# Patient Record
Sex: Female | Born: 1945
Health system: Southern US, Community
[De-identification: ages and names within clinical notes are randomized; demographics above are authoritative.]

## PROBLEM LIST (undated history)

## (undated) DIAGNOSIS — I10 Essential (primary) hypertension: Secondary | ICD-10-CM

## (undated) DIAGNOSIS — E7800 Pure hypercholesterolemia, unspecified: Secondary | ICD-10-CM

---

## 1972-10-13 HISTORY — PX: PARTIAL HYSTERECTOMY: SHX80

## 1999-06-25 ENCOUNTER — Encounter: Payer: Self-pay | Admitting: Emergency Medicine

## 1999-06-25 ENCOUNTER — Emergency Department (HOSPITAL_COMMUNITY): Admission: EM | Admit: 1999-06-25 | Discharge: 1999-06-25 | Payer: Self-pay | Admitting: Emergency Medicine

## 2007-06-24 ENCOUNTER — Ambulatory Visit: Payer: Self-pay | Admitting: Nurse Practitioner

## 2007-06-24 ENCOUNTER — Ambulatory Visit: Payer: Self-pay | Admitting: *Deleted

## 2007-06-24 LAB — CONVERTED CEMR LAB
ALT: 16 units/L (ref 0–53)
AST: 21 units/L (ref 0–37)
Albumin: 4.1 g/dL (ref 3.5–5.2)
Alkaline Phosphatase: 93 units/L (ref 39–117)
BUN: 21 mg/dL (ref 6–23)
Basophils Absolute: 0 10*3/uL (ref 0.0–0.1)
Basophils Relative: 0 % (ref 0–1)
CO2: 25 meq/L (ref 19–32)
Calcium: 10 mg/dL (ref 8.4–10.5)
Chloride: 105 meq/L (ref 96–112)
Creatinine, Ser: 1.13 mg/dL (ref 0.40–1.50)
Eosinophils Absolute: 0.2 10*3/uL (ref 0.0–0.7)
Eosinophils Relative: 4 % (ref 0–5)
Glucose, Bld: 136 mg/dL — ABNORMAL HIGH (ref 70–99)
HCT: 38.2 % — ABNORMAL LOW (ref 39.0–52.0)
Hemoglobin: 12 g/dL — ABNORMAL LOW (ref 13.0–17.0)
Lymphocytes Relative: 43 % (ref 12–46)
Lymphs Abs: 2.4 10*3/uL (ref 0.7–3.3)
MCHC: 31.4 g/dL (ref 30.0–36.0)
MCV: 87.2 fL (ref 78.0–100.0)
Monocytes Absolute: 0.4 10*3/uL (ref 0.2–0.7)
Monocytes Relative: 6 % (ref 3–11)
Neutro Abs: 2.6 10*3/uL (ref 1.7–7.7)
Neutrophils Relative %: 47 % (ref 43–77)
Platelets: 280 10*3/uL (ref 150–400)
Potassium: 4 meq/L (ref 3.5–5.3)
RBC: 4.38 M/uL (ref 4.22–5.81)
RDW: 14.5 % — ABNORMAL HIGH (ref 11.5–14.0)
Sodium: 141 meq/L (ref 135–145)
TSH: 4.82 microintl units/mL (ref 0.350–5.50)
Total Bilirubin: 0.3 mg/dL (ref 0.3–1.2)
Total Protein: 7.8 g/dL (ref 6.0–8.3)
WBC: 5.6 10*3/uL (ref 4.0–10.5)

## 2007-06-25 ENCOUNTER — Encounter (INDEPENDENT_AMBULATORY_CARE_PROVIDER_SITE_OTHER): Payer: Self-pay | Admitting: Nurse Practitioner

## 2007-06-25 LAB — CONVERTED CEMR LAB: Hgb A1c MFr Bld: 7 % — ABNORMAL HIGH (ref 4.6–6.1)

## 2007-07-02 ENCOUNTER — Ambulatory Visit (HOSPITAL_COMMUNITY): Admission: RE | Admit: 2007-07-02 | Discharge: 2007-07-02 | Payer: Self-pay | Admitting: Family Medicine

## 2007-07-12 ENCOUNTER — Encounter: Admission: RE | Admit: 2007-07-12 | Discharge: 2007-07-12 | Payer: Self-pay | Admitting: Family Medicine

## 2007-07-14 ENCOUNTER — Encounter (INDEPENDENT_AMBULATORY_CARE_PROVIDER_SITE_OTHER): Payer: Self-pay | Admitting: Nurse Practitioner

## 2007-07-14 ENCOUNTER — Ambulatory Visit: Payer: Self-pay | Admitting: Family Medicine

## 2007-07-14 LAB — CONVERTED CEMR LAB
Cholesterol: 219 mg/dL — ABNORMAL HIGH (ref 0–200)
HDL: 56 mg/dL (ref >39)
LDL Cholesterol: 142 mg/dL — ABNORMAL HIGH (ref 0–99)
Total CHOL/HDL Ratio: 3.9
Triglycerides: 103 mg/dL (ref <150)
VLDL: 21 mg/dL (ref 0–40)

## 2007-07-28 ENCOUNTER — Ambulatory Visit: Payer: Self-pay | Admitting: Internal Medicine

## 2007-07-30 ENCOUNTER — Ambulatory Visit: Payer: Self-pay | Admitting: Family Medicine

## 2007-08-25 ENCOUNTER — Ambulatory Visit: Payer: Self-pay | Admitting: Family Medicine

## 2007-11-25 ENCOUNTER — Encounter (INDEPENDENT_AMBULATORY_CARE_PROVIDER_SITE_OTHER): Payer: Self-pay | Admitting: Nurse Practitioner

## 2007-11-25 ENCOUNTER — Ambulatory Visit: Payer: Self-pay | Admitting: Internal Medicine

## 2007-11-25 LAB — CONVERTED CEMR LAB
ALT: 14 units/L (ref 0–53)
AST: 17 units/L (ref 0–37)
Albumin: 4.5 g/dL (ref 3.5–5.2)
Alkaline Phosphatase: 67 units/L (ref 39–117)
BUN: 50 mg/dL — ABNORMAL HIGH (ref 6–23)
CO2: 21 meq/L (ref 19–32)
Calcium: 9.7 mg/dL (ref 8.4–10.5)
Chloride: 106 meq/L (ref 96–112)
Cholesterol: 166 mg/dL (ref 0–200)
Creatinine, Ser: 1.72 mg/dL — ABNORMAL HIGH (ref 0.40–1.50)
Glucose, Bld: 105 mg/dL — ABNORMAL HIGH (ref 70–99)
HDL: 52 mg/dL (ref >39)
LDL Cholesterol: 90 mg/dL (ref 0–99)
Potassium: 4.9 meq/L (ref 3.5–5.3)
Sodium: 139 meq/L (ref 135–145)
Total Bilirubin: 0.4 mg/dL (ref 0.3–1.2)
Total CHOL/HDL Ratio: 3.2
Total Protein: 8 g/dL (ref 6.0–8.3)
Triglycerides: 121 mg/dL (ref <150)
VLDL: 24 mg/dL (ref 0–40)

## 2008-07-07 ENCOUNTER — Ambulatory Visit: Payer: Self-pay | Admitting: Internal Medicine

## 2008-07-11 ENCOUNTER — Ambulatory Visit: Payer: Self-pay | Admitting: Family Medicine

## 2008-11-29 ENCOUNTER — Ambulatory Visit: Payer: Self-pay | Admitting: Family Medicine

## 2009-11-14 ENCOUNTER — Ambulatory Visit: Payer: Self-pay | Admitting: Internal Medicine

## 2009-11-14 LAB — CONVERTED CEMR LAB: Microalb, Ur: 0.8 mg/dL (ref 0.00–1.89)

## 2010-08-22 ENCOUNTER — Encounter (INDEPENDENT_AMBULATORY_CARE_PROVIDER_SITE_OTHER): Payer: Self-pay | Admitting: *Deleted

## 2010-08-22 LAB — CONVERTED CEMR LAB
ALT: 14 units/L (ref 0–53)
AST: 15 units/L (ref 0–37)
Albumin: 4.2 g/dL (ref 3.5–5.2)
Alkaline Phosphatase: 64 units/L (ref 39–117)
BUN: 23 mg/dL (ref 6–23)
CO2: 26 meq/L (ref 19–32)
Calcium: 9.7 mg/dL (ref 8.4–10.5)
Chloride: 107 meq/L (ref 96–112)
Cholesterol: 211 mg/dL — ABNORMAL HIGH (ref 0–200)
Creatinine, Ser: 1.24 mg/dL (ref 0.40–1.50)
Glucose, Bld: 101 mg/dL — ABNORMAL HIGH (ref 70–99)
HDL: 57 mg/dL (ref >39)
Hgb A1c MFr Bld: 6.3 % — ABNORMAL HIGH (ref <5.7)
LDL Cholesterol: 138 mg/dL — ABNORMAL HIGH (ref 0–99)
Potassium: 4.6 meq/L (ref 3.5–5.3)
Sodium: 142 meq/L (ref 135–145)
Total Bilirubin: 0.3 mg/dL (ref 0.3–1.2)
Total CHOL/HDL Ratio: 3.7
Total Protein: 7.4 g/dL (ref 6.0–8.3)
Triglycerides: 80 mg/dL (ref <150)
VLDL: 16 mg/dL (ref 0–40)

## 2011-09-04 ENCOUNTER — Encounter (HOSPITAL_COMMUNITY): Payer: Self-pay | Admitting: Emergency Medicine

## 2011-09-04 ENCOUNTER — Emergency Department (HOSPITAL_COMMUNITY)
Admission: EM | Admit: 2011-09-04 | Discharge: 2011-09-04 | Disposition: A | Discharge status: Home / Self Care | Payer: PRIVATE HEALTH INSURANCE | Attending: Emergency Medicine | Admitting: Emergency Medicine

## 2011-09-04 DIAGNOSIS — E119 Type 2 diabetes mellitus without complications: Secondary | ICD-10-CM | POA: Insufficient documentation

## 2011-09-04 DIAGNOSIS — R6884 Jaw pain: Secondary | ICD-10-CM

## 2011-09-04 DIAGNOSIS — I10 Essential (primary) hypertension: Secondary | ICD-10-CM | POA: Insufficient documentation

## 2011-09-04 DIAGNOSIS — H9209 Otalgia, unspecified ear: Secondary | ICD-10-CM | POA: Insufficient documentation

## 2011-09-04 HISTORY — DX: Essential (primary) hypertension: I10

## 2011-09-04 HISTORY — DX: Pure hypercholesterolemia, unspecified: E78.00

## 2011-09-04 MED ORDER — ACETAMINOPHEN 325 MG PO TABS
975.0000 mg | ORAL_TABLET | Freq: Once | ORAL | Status: AC
  Administered 2011-09-04: 975 mg via ORAL
  Filled 2011-09-04: qty 3

## 2011-09-04 MED ORDER — TRAMADOL HCL 50 MG PO TABS: 50.0000 mg | ORAL_TABLET | Freq: Four times a day (QID) | ORAL | Status: AC | PRN

## 2011-09-04 NOTE — ED Provider Notes (Signed)
History     CSN: [id number] Arrival date & time: 09/04/2011  5:53 PM   First MD Initiated Contact with Patient 09/04/11 1756      No chief complaint on file.   (Consider location/radiation/quality/duration/timing/severity/associated sxs/prior treatment) HPI Comments: Patient complains of several days of pain behind her right ear and in her right jaw.  The pain is worse upon chewing and opening and closing her mouth.  She has had no broken teeth.  No fevers.  No changes in voice and no shortness of breath.  No difficulty swallowing.  No redness or swelling is noted.  No ear drainage.  Minimal sinus drainage but no significant sneezing or other cold symptoms.  No sore throat.  Patient presents today because it's continued to hurt and has kept her up at night.  Has not tried any pain medications at home.  Patient is a 65 y.o. female presenting with ear pain. The history is provided by the patient.  Otalgia Pertinent negatives include no headaches, no abdominal pain, no vomiting, no cough and no rash.    Past Medical History  Diagnosis Date  . Diabetes mellitus   . Hypertension     History reviewed. No pertinent past surgical history.  No family history on file.  History  Substance Use Topics  . Smoking status: Never Smoker   . Smokeless tobacco: Not on file  . Alcohol Use:       Review of Systems  Constitutional: Negative.  Negative for fever and chills.  HENT: Positive for ear pain.   Eyes: Negative.  Negative for discharge and redness.  Respiratory: Negative.  Negative for cough and shortness of breath.   Cardiovascular: Negative.  Negative for chest pain.  Gastrointestinal: Negative.  Negative for nausea, vomiting and abdominal pain.  Genitourinary: Negative.  Negative for hematuria.  Musculoskeletal: Negative.  Negative for back pain.  Skin: Negative.  Negative for color change and rash.  Neurological: Negative for syncope and headaches.  Hematological: Negative.   Negative for adenopathy.  Psychiatric/Behavioral: Negative.  Negative for confusion.  All other systems reviewed and are negative.    Allergies  Review of patient's allergies indicates no known allergies.  Home Medications  No current outpatient prescriptions on file.  There were no vitals taken for this visit.  Physical Exam  Constitutional: He is oriented to person, place, and time. He appears well-developed and well-nourished.  HENT:  Head: Normocephalic and atraumatic.  Right Ear: External ear normal.  Left Ear: External ear normal.  Mouth/Throat: Oropharynx is clear and moist. No oropharyngeal exudate.  Eyes: Conjunctivae and EOM are normal. Pupils are equal, round, and reactive to light.  Neck: Normal range of motion. Neck supple.  Pulmonary/Chest: Effort normal.  Musculoskeletal: Normal range of motion.  Lymphadenopathy:    He has no cervical adenopathy.  Neurological: He is alert and oriented to person, place, and time.  Skin: Skin is warm and dry.  Psychiatric: He has a normal mood and affect. His behavior is normal. Judgment and thought content normal.    ED Course  Procedures (including critical care time)  Labs Reviewed - No data to display No results found.   No diagnosis found.    MDM  Patient presents with right-sided jaw pain without signs of infectious etiology.  Her teeth and gums appear healthy and intact without signs of redness or tenderness on exam.  Ear shows no signs of otitis media or otitis externa.  No tenderness over palpation of her mastoid to  suggest mastoiditis.  Patient has no lymphadenopathy in the submandibular region or cervical lymphadenopathy.  Patient also has no fevers.  She does note that she grinds her teeth at night and this may cause some TMJ.  At this point I will prescribe the patient some pain medicine and advised her to followup with her primary care physician and/or the oral facial surgeon.  She also knows to return if she  starts having other symptoms such as increased redness, swelling, fevers or other concerns.        Nat Christen, MD 09/04/11 [phone]

## 2013-01-30 ENCOUNTER — Emergency Department (HOSPITAL_COMMUNITY): Payer: Medicare HMO

## 2013-01-30 ENCOUNTER — Emergency Department (HOSPITAL_COMMUNITY)
Admission: EM | Admit: 2013-01-30 | Discharge: 2013-01-30 | Disposition: A | Discharge status: Home / Self Care | Payer: Medicare HMO | Attending: Emergency Medicine | Admitting: Emergency Medicine

## 2013-01-30 ENCOUNTER — Encounter (HOSPITAL_COMMUNITY): Payer: Self-pay | Admitting: Emergency Medicine

## 2013-01-30 DIAGNOSIS — E7800 Pure hypercholesterolemia, unspecified: Secondary | ICD-10-CM | POA: Insufficient documentation

## 2013-01-30 DIAGNOSIS — R071 Chest pain on breathing: Secondary | ICD-10-CM | POA: Insufficient documentation

## 2013-01-30 DIAGNOSIS — R059 Cough, unspecified: Secondary | ICD-10-CM

## 2013-01-30 DIAGNOSIS — R0602 Shortness of breath: Secondary | ICD-10-CM | POA: Insufficient documentation

## 2013-01-30 DIAGNOSIS — Z79899 Other long term (current) drug therapy: Secondary | ICD-10-CM | POA: Insufficient documentation

## 2013-01-30 DIAGNOSIS — R05 Cough: Secondary | ICD-10-CM

## 2013-01-30 DIAGNOSIS — E119 Type 2 diabetes mellitus without complications: Secondary | ICD-10-CM | POA: Insufficient documentation

## 2013-01-30 DIAGNOSIS — J069 Acute upper respiratory infection, unspecified: Secondary | ICD-10-CM | POA: Insufficient documentation

## 2013-01-30 DIAGNOSIS — I10 Essential (primary) hypertension: Secondary | ICD-10-CM | POA: Insufficient documentation

## 2013-01-30 DIAGNOSIS — Z7982 Long term (current) use of aspirin: Secondary | ICD-10-CM | POA: Insufficient documentation

## 2013-01-30 DIAGNOSIS — J3489 Other specified disorders of nose and nasal sinuses: Secondary | ICD-10-CM | POA: Insufficient documentation

## 2013-01-30 DIAGNOSIS — R209 Unspecified disturbances of skin sensation: Secondary | ICD-10-CM | POA: Insufficient documentation

## 2013-01-30 MED ORDER — BENZONATATE 100 MG PO CAPS: 100.0000 mg | ORAL_CAPSULE | Freq: Three times a day (TID) | ORAL | Status: DC

## 2013-01-30 NOTE — ED Notes (Signed)
Pt c/o URI and cough with intermittent fever

## 2013-01-30 NOTE — ED Notes (Signed)
Patient states she has had this non productive cough x2 days. She has not taken any OTC's to treat at home. She denies having any fevers with the cough.

## 2013-01-30 NOTE — ED Provider Notes (Signed)
History    This chart was scribed for non-physician practitioner Raymon Mutton working with Ward Givens, MD by Quintella Reichert, ED Scribe. This patient was seen in room TR08C/TR08C and the patient's care was started at 4:04 PM .   CSN: [id number]  Arrival date & time 01/30/13  1418       Chief Complaint  Patient presents with  . URI  . Cough     The history is provided by the patient. No language interpreter was used.   Kristen Cannon is a 67 y.o. female who presents to the Emergency Department complaining of cough that began several days ago, with accompanying nasal congestion and intermittent clear rhinorrhea.  Pt describes cough as wet but not productive of sputum.  She states cough is worst at night, and is not relieved by anything she has tried.  She also reports sharp chest wall pain with cough, mild nasal pain due to congestion, and mild intermittent SOB.  Pt reports she has been baby-sitting her grandson, who also has a cold.  She denies sore throat, difficulty swallowing, back pain, neck pain, eye itching, eye discharge, difficulty swallowing, urinary or bowel symptoms, or tinnitus.  She also reports a fever that occurred 2 days ago, but did not take her temperature.  She has not been taking medication.  Pt also reports intermittent left-sided numbness and tingling with onset several months ago, that is currently at baseline.  She states it is worse at night, and that episodes of numbness last for several seconds.  Pt denies weakness of left extremities.  She has not consulted her PCP in regard to numbness, but has appointment on 02/14/13.  PCP is Dr. Allyne Gee.   Past Medical History  Diagnosis Date  . Diabetes mellitus   . Hypertension   . High cholesterol     History reviewed. No pertinent past surgical history.  History reviewed. No pertinent family history.  History  Substance Use Topics  . Smoking status: Never Smoker   . Smokeless tobacco: Not on file  .  Alcohol Use:     OB History   Grav Para Term Preterm Abortions TAB SAB Ect Mult Living                  Review of Systems  HENT: Negative for trouble swallowing, neck pain, tinnitus and ear discharge.   Eyes: Negative for itching.  Respiratory:       Chest wall pain on coughing  Gastrointestinal: Negative for nausea, vomiting, abdominal pain and diarrhea.  Genitourinary: Negative for dysuria and difficulty urinating.  Musculoskeletal: Negative for back pain.  Neurological: Positive for numbness. Negative for weakness.  All other systems reviewed and are negative.     Allergies  Review of patient's allergies indicates no known allergies.  Home Medications   Current Outpatient Rx  Name  Route  Sig  Dispense  Refill  . aspirin EC 81 MG tablet   Oral   Take 81 mg by mouth every morning.         Marland Kitchen atorvastatin (LIPITOR) 20 MG tablet   Oral   Take 20 mg by mouth daily.         . cloNIDine (CATAPRES) 0.2 MG tablet   Oral   Take 0.2 mg by mouth daily.         Marland Kitchen glipiZIDE (GLUCOTROL) 5 MG tablet   Oral   Take 5 mg by mouth 2 (two) times daily before a meal.          .  hydrochlorothiazide (HYDRODIURIL) 25 MG tablet   Oral   Take 25 mg by mouth daily.         Marland Kitchen lisinopril (PRINIVIL,ZESTRIL) 20 MG tablet   Oral   Take 20 mg by mouth 2 (two) times daily.           BP 129/81  Pulse 88  Temp(Src) 98.2 F (36.8 C) (Oral)  Resp 18  SpO2 98%  Physical Exam  Nursing note and vitals reviewed. Constitutional: She is oriented to person, place, and time. She appears well-developed and well-nourished. No distress.  HENT:  Head: Normocephalic and atraumatic.  Mouth/Throat: Oropharynx is clear and moist. No oropharyngeal exudate.  Uvula midline, no erythema, no exudate. Good nasal patency.  Eyes: Conjunctivae and EOM are normal. Pupils are equal, round, and reactive to light. Right eye exhibits no discharge. Left eye exhibits no discharge.  Neck: Normal range  of motion. Neck supple. No tracheal deviation present.  Negative lymphadenopathy  Cardiovascular: Normal rate, regular rhythm, normal heart sounds and intact distal pulses.   No murmur heard. Radial pulses 2+ bilaterally Pedal pulses 2+ bilaterally Negative leg and ankle swelling Negative pitting edema  Pulmonary/Chest: Effort normal and breath sounds normal. No respiratory distress. She has no wheezes. She has no rales.  Musculoskeletal: Normal range of motion. She exhibits no edema and no tenderness.  Lymphadenopathy:    She has no cervical adenopathy.  Neurological: She is alert and oriented to person, place, and time. No cranial nerve deficit. She exhibits normal muscle tone. Coordination normal.  Skin: Skin is warm and dry. No rash noted. No erythema.  Psychiatric: She has a normal mood and affect. Her behavior is normal. Thought content normal.     ED Course  Procedures (including critical care time)  DIAGNOSTIC STUDIES: Oxygen Saturation is 98% on room air, normal by my interpretation.    COORDINATION OF CARE: 4:16 PM-Discussed likelihood that symptoms are due to URI, and explained treatment plan which includes imaging with pt at bedside and pt agreed to plan.   10:32 AM: Informed pt that CXRresults were negative, and that symptoms are most likely due to viral URI.  Explained treatment plan including Tessalon, rest, hydration, f/u with PCP this week, and f/u at ED if new symptoms develop.  Pt agreed to plan.   Labs Reviewed - No data to display No results found.   1. URI (upper respiratory infection)   2. Cough   3. HTN (hypertension)   4. DM (diabetes mellitus)       MDM  I personally performed the services described in this documentation, which was scribed in my presence. The recorded information has been reviewed and is accurate.  Patient afebrile, normotensive, non-tachycardic, alert and oriented. Patient cooperative and calm throughout exam. Wet cough noted  with no sputum released from mouth. Physical exam unremarkable. Chest xray negative findings - nurse called and received findings from radiology. Patient aseptic, non-toxic appearing, no acute distress. Discharged patient. Suspected URI - viral in nature. Discharged patient with Tessalon to aid in cough relief. Discussed with patient to follow-up with Dr. Allyne Gee regarding visit to the ED, need to be re-evaluated, as well as discuss the sporadic episodes of numbness to the left side. Discussed with patient to rest, stay hydrated, Tylenol can be used for fever if it should develop. Discussed with patient to monitor symptoms and if symptoms worsen or change to report back to the ED. Patient agreed to plan of care, understood, all questions answered.  Raymon Mutton, PA-C 01/31/13 1032 ED provider identified for [facility], [place] 02/01/13 1823

## 2013-02-02 NOTE — ED Provider Notes (Signed)
Medical screening examination/treatment/procedure(s) were performed by non-physician practitioner and as supervising physician I was immediately available for consultation/collaboration. Tarus Briski, MD, FACEP   Severin Bou L Kelli Egolf, MD 02/02/13 1052 

## 2013-12-13 ENCOUNTER — Other Ambulatory Visit: Payer: Self-pay

## 2013-12-13 DIAGNOSIS — Z1231 Encounter for screening mammogram for malignant neoplasm of breast: Secondary | ICD-10-CM

## 2013-12-13 DIAGNOSIS — Z803 Family history of malignant neoplasm of breast: Secondary | ICD-10-CM

## 2013-12-14 ENCOUNTER — Ambulatory Visit
Admission: RE | Admit: 2013-12-14 | Discharge: 2013-12-14 | Disposition: A | Discharge status: Home / Self Care | Payer: Medicare HMO | Source: Ambulatory Visit

## 2013-12-14 DIAGNOSIS — Z803 Family history of malignant neoplasm of breast: Secondary | ICD-10-CM

## 2013-12-14 DIAGNOSIS — Z1231 Encounter for screening mammogram for malignant neoplasm of breast: Secondary | ICD-10-CM

## 2014-10-25 DIAGNOSIS — E782 Mixed hyperlipidemia: Secondary | ICD-10-CM | POA: Diagnosis not present

## 2014-10-25 DIAGNOSIS — Z0000 Encounter for general adult medical examination without abnormal findings: Secondary | ICD-10-CM | POA: Diagnosis not present

## 2014-12-12 ENCOUNTER — Other Ambulatory Visit: Payer: Self-pay

## 2014-12-12 DIAGNOSIS — Z1231 Encounter for screening mammogram for malignant neoplasm of breast: Secondary | ICD-10-CM

## 2014-12-27 ENCOUNTER — Ambulatory Visit
Admission: RE | Admit: 2014-12-27 | Discharge: 2014-12-27 | Disposition: A | Discharge status: Home / Self Care | Payer: Medicare Other | Source: Ambulatory Visit

## 2014-12-27 DIAGNOSIS — Z1231 Encounter for screening mammogram for malignant neoplasm of breast: Secondary | ICD-10-CM

## 2015-03-05 DIAGNOSIS — E1122 Type 2 diabetes mellitus with diabetic chronic kidney disease: Secondary | ICD-10-CM | POA: Diagnosis not present

## 2015-03-05 DIAGNOSIS — E669 Obesity, unspecified: Secondary | ICD-10-CM | POA: Diagnosis not present

## 2015-03-05 DIAGNOSIS — Z713 Dietary counseling and surveillance: Secondary | ICD-10-CM | POA: Diagnosis not present

## 2015-03-05 DIAGNOSIS — N183 Chronic kidney disease, stage 3 (moderate): Secondary | ICD-10-CM | POA: Diagnosis not present

## 2015-04-06 DIAGNOSIS — H40013 Open angle with borderline findings, low risk, bilateral: Secondary | ICD-10-CM | POA: Diagnosis not present

## 2015-04-06 DIAGNOSIS — D3101 Benign neoplasm of right conjunctiva: Secondary | ICD-10-CM | POA: Diagnosis not present

## 2015-04-06 DIAGNOSIS — H2513 Age-related nuclear cataract, bilateral: Secondary | ICD-10-CM | POA: Diagnosis not present

## 2015-04-06 DIAGNOSIS — E119 Type 2 diabetes mellitus without complications: Secondary | ICD-10-CM | POA: Diagnosis not present

## 2015-06-06 DIAGNOSIS — N183 Chronic kidney disease, stage 3 (moderate): Secondary | ICD-10-CM | POA: Diagnosis not present

## 2015-06-06 DIAGNOSIS — I129 Hypertensive chronic kidney disease with stage 1 through stage 4 chronic kidney disease, or unspecified chronic kidney disease: Secondary | ICD-10-CM | POA: Diagnosis not present

## 2015-06-06 DIAGNOSIS — E1122 Type 2 diabetes mellitus with diabetic chronic kidney disease: Secondary | ICD-10-CM | POA: Diagnosis not present

## 2015-08-30 DIAGNOSIS — I129 Hypertensive chronic kidney disease with stage 1 through stage 4 chronic kidney disease, or unspecified chronic kidney disease: Secondary | ICD-10-CM | POA: Diagnosis not present

## 2015-08-30 DIAGNOSIS — N08 Glomerular disorders in diseases classified elsewhere: Secondary | ICD-10-CM | POA: Diagnosis not present

## 2015-08-30 DIAGNOSIS — N183 Chronic kidney disease, stage 3 (moderate): Secondary | ICD-10-CM | POA: Diagnosis not present

## 2015-08-30 DIAGNOSIS — I10 Essential (primary) hypertension: Secondary | ICD-10-CM | POA: Diagnosis not present

## 2015-08-30 DIAGNOSIS — E1122 Type 2 diabetes mellitus with diabetic chronic kidney disease: Secondary | ICD-10-CM | POA: Diagnosis not present

## 2015-09-20 DIAGNOSIS — H40013 Open angle with borderline findings, low risk, bilateral: Secondary | ICD-10-CM | POA: Diagnosis not present

## 2015-09-20 DIAGNOSIS — H2513 Age-related nuclear cataract, bilateral: Secondary | ICD-10-CM | POA: Diagnosis not present

## 2015-09-20 DIAGNOSIS — H35363 Drusen (degenerative) of macula, bilateral: Secondary | ICD-10-CM | POA: Diagnosis not present

## 2015-09-20 DIAGNOSIS — H25013 Cortical age-related cataract, bilateral: Secondary | ICD-10-CM | POA: Diagnosis not present

## 2015-09-21 DIAGNOSIS — N390 Urinary tract infection, site not specified: Secondary | ICD-10-CM | POA: Diagnosis not present

## 2015-12-04 ENCOUNTER — Other Ambulatory Visit: Payer: Self-pay

## 2015-12-04 DIAGNOSIS — Z1231 Encounter for screening mammogram for malignant neoplasm of breast: Secondary | ICD-10-CM

## 2015-12-06 DIAGNOSIS — I129 Hypertensive chronic kidney disease with stage 1 through stage 4 chronic kidney disease, or unspecified chronic kidney disease: Secondary | ICD-10-CM | POA: Diagnosis not present

## 2015-12-06 DIAGNOSIS — N182 Chronic kidney disease, stage 2 (mild): Secondary | ICD-10-CM | POA: Diagnosis not present

## 2015-12-06 DIAGNOSIS — N08 Glomerular disorders in diseases classified elsewhere: Secondary | ICD-10-CM | POA: Diagnosis not present

## 2015-12-06 DIAGNOSIS — E1122 Type 2 diabetes mellitus with diabetic chronic kidney disease: Secondary | ICD-10-CM | POA: Diagnosis not present

## 2015-12-13 DIAGNOSIS — N182 Chronic kidney disease, stage 2 (mild): Secondary | ICD-10-CM | POA: Diagnosis not present

## 2015-12-13 DIAGNOSIS — I129 Hypertensive chronic kidney disease with stage 1 through stage 4 chronic kidney disease, or unspecified chronic kidney disease: Secondary | ICD-10-CM | POA: Diagnosis not present

## 2016-01-03 ENCOUNTER — Ambulatory Visit: Payer: Medicare Other

## 2016-01-03 ENCOUNTER — Ambulatory Visit
Admission: RE | Admit: 2016-01-03 | Discharge: 2016-01-03 | Disposition: A | Discharge status: Home / Self Care | Payer: Medicare Other | Source: Ambulatory Visit

## 2016-01-03 DIAGNOSIS — N08 Glomerular disorders in diseases classified elsewhere: Secondary | ICD-10-CM | POA: Diagnosis not present

## 2016-01-03 DIAGNOSIS — I129 Hypertensive chronic kidney disease with stage 1 through stage 4 chronic kidney disease, or unspecified chronic kidney disease: Secondary | ICD-10-CM | POA: Diagnosis not present

## 2016-01-03 DIAGNOSIS — Z1231 Encounter for screening mammogram for malignant neoplasm of breast: Secondary | ICD-10-CM | POA: Diagnosis not present

## 2016-01-03 DIAGNOSIS — E1122 Type 2 diabetes mellitus with diabetic chronic kidney disease: Secondary | ICD-10-CM | POA: Diagnosis not present

## 2016-01-03 DIAGNOSIS — N182 Chronic kidney disease, stage 2 (mild): Secondary | ICD-10-CM | POA: Diagnosis not present

## 2016-01-10 DIAGNOSIS — I129 Hypertensive chronic kidney disease with stage 1 through stage 4 chronic kidney disease, or unspecified chronic kidney disease: Secondary | ICD-10-CM | POA: Diagnosis not present

## 2016-01-10 DIAGNOSIS — N182 Chronic kidney disease, stage 2 (mild): Secondary | ICD-10-CM | POA: Diagnosis not present

## 2016-02-15 DIAGNOSIS — N182 Chronic kidney disease, stage 2 (mild): Secondary | ICD-10-CM | POA: Diagnosis not present

## 2016-02-15 DIAGNOSIS — I129 Hypertensive chronic kidney disease with stage 1 through stage 4 chronic kidney disease, or unspecified chronic kidney disease: Secondary | ICD-10-CM | POA: Diagnosis not present

## 2016-03-19 DIAGNOSIS — E782 Mixed hyperlipidemia: Secondary | ICD-10-CM | POA: Diagnosis not present

## 2016-03-19 DIAGNOSIS — E1122 Type 2 diabetes mellitus with diabetic chronic kidney disease: Secondary | ICD-10-CM | POA: Diagnosis not present

## 2016-03-19 DIAGNOSIS — N182 Chronic kidney disease, stage 2 (mild): Secondary | ICD-10-CM | POA: Diagnosis not present

## 2016-03-19 DIAGNOSIS — I129 Hypertensive chronic kidney disease with stage 1 through stage 4 chronic kidney disease, or unspecified chronic kidney disease: Secondary | ICD-10-CM | POA: Diagnosis not present

## 2016-03-19 DIAGNOSIS — I10 Essential (primary) hypertension: Secondary | ICD-10-CM | POA: Diagnosis not present

## 2016-04-11 DIAGNOSIS — H25013 Cortical age-related cataract, bilateral: Secondary | ICD-10-CM | POA: Diagnosis not present

## 2016-04-11 DIAGNOSIS — E119 Type 2 diabetes mellitus without complications: Secondary | ICD-10-CM | POA: Diagnosis not present

## 2016-04-11 DIAGNOSIS — H2513 Age-related nuclear cataract, bilateral: Secondary | ICD-10-CM | POA: Diagnosis not present

## 2016-04-11 DIAGNOSIS — H40013 Open angle with borderline findings, low risk, bilateral: Secondary | ICD-10-CM | POA: Diagnosis not present

## 2016-06-20 DIAGNOSIS — E1122 Type 2 diabetes mellitus with diabetic chronic kidney disease: Secondary | ICD-10-CM | POA: Diagnosis not present

## 2016-06-20 DIAGNOSIS — I129 Hypertensive chronic kidney disease with stage 1 through stage 4 chronic kidney disease, or unspecified chronic kidney disease: Secondary | ICD-10-CM | POA: Diagnosis not present

## 2016-06-20 DIAGNOSIS — I10 Essential (primary) hypertension: Secondary | ICD-10-CM | POA: Diagnosis not present

## 2016-06-20 DIAGNOSIS — Z0000 Encounter for general adult medical examination without abnormal findings: Secondary | ICD-10-CM | POA: Diagnosis not present

## 2016-06-20 DIAGNOSIS — N183 Chronic kidney disease, stage 3 (moderate): Secondary | ICD-10-CM | POA: Diagnosis not present

## 2016-06-20 DIAGNOSIS — E782 Mixed hyperlipidemia: Secondary | ICD-10-CM | POA: Diagnosis not present

## 2016-12-05 ENCOUNTER — Other Ambulatory Visit: Payer: Self-pay | Admitting: Nurse Practitioner

## 2016-12-05 DIAGNOSIS — Z1231 Encounter for screening mammogram for malignant neoplasm of breast: Secondary | ICD-10-CM

## 2017-01-05 ENCOUNTER — Ambulatory Visit
Admission: RE | Admit: 2017-01-05 | Discharge: 2017-01-05 | Disposition: A | Discharge status: Home / Self Care | Payer: Medicare Other | Source: Ambulatory Visit | Attending: Nurse Practitioner | Admitting: Nurse Practitioner

## 2017-01-05 DIAGNOSIS — Z1231 Encounter for screening mammogram for malignant neoplasm of breast: Secondary | ICD-10-CM

## 2017-03-11 DIAGNOSIS — N183 Chronic kidney disease, stage 3 (moderate): Secondary | ICD-10-CM | POA: Diagnosis not present

## 2017-03-11 DIAGNOSIS — E1122 Type 2 diabetes mellitus with diabetic chronic kidney disease: Secondary | ICD-10-CM | POA: Diagnosis not present

## 2017-03-11 DIAGNOSIS — I129 Hypertensive chronic kidney disease with stage 1 through stage 4 chronic kidney disease, or unspecified chronic kidney disease: Secondary | ICD-10-CM | POA: Diagnosis not present

## 2017-03-11 DIAGNOSIS — E782 Mixed hyperlipidemia: Secondary | ICD-10-CM | POA: Diagnosis not present

## 2017-06-24 DIAGNOSIS — N08 Glomerular disorders in diseases classified elsewhere: Secondary | ICD-10-CM | POA: Diagnosis not present

## 2017-06-24 DIAGNOSIS — R21 Rash and other nonspecific skin eruption: Secondary | ICD-10-CM | POA: Diagnosis not present

## 2017-06-24 DIAGNOSIS — Z1231 Encounter for screening mammogram for malignant neoplasm of breast: Secondary | ICD-10-CM | POA: Diagnosis not present

## 2017-06-24 DIAGNOSIS — I10 Essential (primary) hypertension: Secondary | ICD-10-CM | POA: Diagnosis not present

## 2017-06-24 DIAGNOSIS — E1122 Type 2 diabetes mellitus with diabetic chronic kidney disease: Secondary | ICD-10-CM | POA: Diagnosis not present

## 2017-06-24 DIAGNOSIS — Z0000 Encounter for general adult medical examination without abnormal findings: Secondary | ICD-10-CM | POA: Diagnosis not present

## 2017-06-24 DIAGNOSIS — N183 Chronic kidney disease, stage 3 (moderate): Secondary | ICD-10-CM | POA: Diagnosis not present

## 2017-12-07 ENCOUNTER — Other Ambulatory Visit: Payer: Self-pay | Admitting: Nurse Practitioner

## 2017-12-07 DIAGNOSIS — Z1231 Encounter for screening mammogram for malignant neoplasm of breast: Secondary | ICD-10-CM

## 2018-01-06 ENCOUNTER — Ambulatory Visit
Admission: RE | Admit: 2018-01-06 | Discharge: 2018-01-06 | Disposition: A | Discharge status: Home / Self Care | Payer: Medicare Other | Source: Ambulatory Visit | Attending: Nurse Practitioner | Admitting: Nurse Practitioner

## 2018-01-06 DIAGNOSIS — Z1231 Encounter for screening mammogram for malignant neoplasm of breast: Secondary | ICD-10-CM

## 2018-07-07 DIAGNOSIS — F341 Dysthymic disorder: Secondary | ICD-10-CM

## 2018-07-07 DIAGNOSIS — Z634 Disappearance and death of family member: Secondary | ICD-10-CM

## 2018-07-07 DIAGNOSIS — N760 Acute vaginitis: Secondary | ICD-10-CM

## 2018-07-07 DIAGNOSIS — G4700 Insomnia, unspecified: Secondary | ICD-10-CM

## 2018-07-07 DIAGNOSIS — N183 Chronic kidney disease, stage 3 (moderate): Secondary | ICD-10-CM

## 2018-07-07 DIAGNOSIS — Z23 Encounter for immunization: Secondary | ICD-10-CM

## 2018-07-07 DIAGNOSIS — Z1331 Encounter for screening for depression: Secondary | ICD-10-CM

## 2018-07-07 DIAGNOSIS — E1121 Type 2 diabetes mellitus with diabetic nephropathy: Secondary | ICD-10-CM

## 2018-07-07 DIAGNOSIS — I129 Hypertensive chronic kidney disease with stage 1 through stage 4 chronic kidney disease, or unspecified chronic kidney disease: Secondary | ICD-10-CM

## 2018-07-10 ENCOUNTER — Encounter (HOSPITAL_COMMUNITY): Payer: Self-pay | Admitting: Emergency Medicine

## 2018-07-10 ENCOUNTER — Emergency Department (HOSPITAL_COMMUNITY)
Admission: EM | Admit: 2018-07-10 | Discharge: 2018-07-10 | Disposition: A | Discharge status: Home / Self Care | Payer: Medicare Other | Attending: Emergency Medicine | Admitting: Emergency Medicine

## 2018-07-10 ENCOUNTER — Other Ambulatory Visit: Payer: Self-pay

## 2018-07-10 DIAGNOSIS — Z7982 Long term (current) use of aspirin: Secondary | ICD-10-CM | POA: Diagnosis not present

## 2018-07-10 DIAGNOSIS — Z7984 Long term (current) use of oral hypoglycemic drugs: Secondary | ICD-10-CM | POA: Insufficient documentation

## 2018-07-10 DIAGNOSIS — I10 Essential (primary) hypertension: Secondary | ICD-10-CM | POA: Insufficient documentation

## 2018-07-10 DIAGNOSIS — R070 Pain in throat: Secondary | ICD-10-CM | POA: Diagnosis present

## 2018-07-10 DIAGNOSIS — J020 Streptococcal pharyngitis: Secondary | ICD-10-CM | POA: Diagnosis not present

## 2018-07-10 DIAGNOSIS — E119 Type 2 diabetes mellitus without complications: Secondary | ICD-10-CM | POA: Insufficient documentation

## 2018-07-10 DIAGNOSIS — Z79899 Other long term (current) drug therapy: Secondary | ICD-10-CM | POA: Insufficient documentation

## 2018-07-10 DIAGNOSIS — R509 Fever, unspecified: Secondary | ICD-10-CM | POA: Insufficient documentation

## 2018-07-10 LAB — COMPREHENSIVE METABOLIC PANEL
ALT: 27 U/L (ref 0–44)
AST: 31 U/L (ref 15–41)
Albumin: 3.6 g/dL (ref 3.5–5.0)
Alkaline Phosphatase: 69 U/L (ref 38–126)
Anion gap: 10 (ref 5–15)
BUN: 17 mg/dL (ref 8–23)
CO2: 28 mmol/L (ref 22–32)
Calcium: 9.7 mg/dL (ref 8.9–10.3)
Chloride: 98 mmol/L (ref 98–111)
Creatinine, Ser: 1.25 mg/dL — ABNORMAL HIGH (ref 0.44–1.00)
GFR calc Af Amer: 48 mL/min — ABNORMAL LOW (ref >60)
GFR calc non Af Amer: 41 mL/min — ABNORMAL LOW (ref >60)
Glucose, Bld: 135 mg/dL — ABNORMAL HIGH (ref 70–99)
Potassium: 3.5 mmol/L (ref 3.5–5.1)
Sodium: 136 mmol/L (ref 135–145)
Total Bilirubin: 0.6 mg/dL (ref 0.3–1.2)
Total Protein: 7.6 g/dL (ref 6.5–8.1)

## 2018-07-10 LAB — GROUP A STREP BY PCR: Group A Strep by PCR: DETECTED — AB

## 2018-07-10 LAB — CBC WITH DIFFERENTIAL/PLATELET
Abs Immature Granulocytes: 0.1 10*3/uL (ref 0.0–0.1)
Basophils Absolute: 0 10*3/uL (ref 0.0–0.1)
Basophils Relative: 0 %
Eosinophils Absolute: 0.2 10*3/uL (ref 0.0–0.7)
Eosinophils Relative: 2 %
HCT: 36.7 % (ref 36.0–46.0)
Hemoglobin: 11.2 g/dL — ABNORMAL LOW (ref 12.0–15.0)
Immature Granulocytes: 1 %
Lymphocytes Relative: 22 %
Lymphs Abs: 2.6 10*3/uL (ref 0.7–4.0)
MCH: 27.7 pg (ref 26.0–34.0)
MCHC: 30.5 g/dL (ref 30.0–36.0)
MCV: 90.6 fL (ref 78.0–100.0)
Monocytes Absolute: 0.9 10*3/uL (ref 0.1–1.0)
Monocytes Relative: 8 %
Neutro Abs: 8.1 10*3/uL — ABNORMAL HIGH (ref 1.7–7.7)
Neutrophils Relative %: 67 %
Platelets: 264 10*3/uL (ref 150–400)
RBC: 4.05 MIL/uL (ref 3.87–5.11)
RDW: 13.7 % (ref 11.5–15.5)
WBC: 11.9 10*3/uL — ABNORMAL HIGH (ref 4.0–10.5)

## 2018-07-10 LAB — PROTIME-INR
INR: 1.02
Prothrombin Time: 13.3 seconds (ref 11.4–15.2)

## 2018-07-10 LAB — I-STAT CG4 LACTIC ACID, ED: Lactic Acid, Venous: 1.85 mmol/L (ref 0.5–1.9)

## 2018-07-10 MED ORDER — PENICILLIN G BENZATHINE & PROC 1200000 UNIT/2ML IM SUSP
1.2000 10*6.[IU] | Freq: Once | INTRAMUSCULAR | Status: AC
  Administered 2018-07-10: 1.2 10*6.[IU] via INTRAMUSCULAR
  Filled 2018-07-10: qty 2

## 2018-07-10 MED ORDER — ACETAMINOPHEN 325 MG PO TABS
650.0000 mg | ORAL_TABLET | Freq: Once | ORAL | Status: AC | PRN
  Administered 2018-07-10: 650 mg via ORAL
  Filled 2018-07-10: qty 2

## 2018-07-10 NOTE — ED Triage Notes (Signed)
Pt reports sore throat since Wednesday night. Reports fevers at home, febrile in triage at 101.

## 2018-07-10 NOTE — ED Notes (Signed)
Patient verbalizes understanding of discharge instructions. Opportunity for questioning and answers were provided. Armband removed by staff, pt discharged from ED ambulatory.   

## 2018-07-10 NOTE — ED Notes (Signed)
Pt oral temp 98.5

## 2018-07-10 NOTE — Discharge Instructions (Signed)
Take Tylenol as prescribed over-the-counter every 6 hours as needed for fever and body aches.  Make sure to drink plenty of fluids.  Please follow-up with your doctor in 3 to 4 days for recheck.  Please return the emergency department if you develop any new or worsening symptoms.

## 2018-07-10 NOTE — ED Notes (Signed)
Pt unable to provide urine sample. Urine cup given to pt. Pt aware urine sample is needed.

## 2018-07-11 NOTE — ED Provider Notes (Addendum)
[place] EMERGENCY DEPARTMENT Provider Note   CSN: [id number] Arrival date & time: 07/10/18  1222     History   Chief Complaint Chief Complaint  Patient presents with  . Sore Throat  . Fever    HPI Kristen Cannon is a 71 y.o. female with history of hypertension, diabetes, high cholesterol who presents with a 4-day history of sore throat and fever.  Patient has had associated body aches.  Patient denies any ear pain, nasal congestion, cough, chest pain, shortness of breath, abdominal pain, nausea, vomiting, urinary symptoms.  Patient has taken Tylenol at home for her symptoms.  She has not been around anyone else with similar symptoms, including children.  HPI  Past Medical History:  Diagnosis Date  . Diabetes mellitus   . High cholesterol   . Hypertension     There are no active problems to display for this patient.   History reviewed. No pertinent surgical history.   OB History   None      Home Medications    Prior to Admission medications   Medication Sig Start Date End Date Taking? Authorizing Provider  aspirin EC 81 MG tablet Take 81 mg by mouth every morning.    [provider]  atorvastatin (LIPITOR) 20 MG tablet Take 20 mg by mouth daily.    [provider]  benzonatate (TESSALON) 100 MG capsule Take 1 capsule (100 mg total) by mouth every 8 (eight) hours. 01/30/13   Sciacca, Marissa, PA-C  cloNIDine (CATAPRES) 0.2 MG tablet Take 0.2 mg by mouth daily.    [provider]  glipiZIDE (GLUCOTROL) 5 MG tablet Take 5 mg by mouth 2 (two) times daily before a meal.     [provider]  hydrochlorothiazide (HYDRODIURIL) 25 MG tablet Take 25 mg by mouth daily.    [provider]  lisinopril (PRINIVIL,ZESTRIL) 20 MG tablet Take 20 mg by mouth 2 (two) times daily.    [provider]    Family History Family History  Problem Relation Age of Onset  . Breast cancer Sister   . Breast  cancer Daughter     Social History Social History   Tobacco Use  . Smoking status: Never Smoker  Substance Use Topics  . Alcohol use: Not on file  . Drug use: No     Allergies   Patient has no known allergies.   Review of Systems Review of Systems  Constitutional: Positive for fever. Negative for chills.  HENT: Positive for sore throat. Negative for facial swelling.   Respiratory: Negative for cough and shortness of breath.   Cardiovascular: Negative for chest pain.  Gastrointestinal: Negative for abdominal pain, nausea and vomiting.  Genitourinary: Negative for dysuria.  Musculoskeletal: Positive for myalgias. Negative for back pain.  Skin: Negative for rash and wound.  Neurological: Negative for headaches.  Psychiatric/Behavioral: The patient is not nervous/anxious.      Physical Exam Updated Vital Signs BP 111/88   Pulse 73   Temp (!) 101 F (38.3 C) (Oral)   Resp 16   SpO2 95%   Physical Exam  Constitutional: She appears well-developed and well-nourished. No distress.  HENT:  Head: Normocephalic and atraumatic.  Right Ear: Tympanic membrane normal.  Left Ear: Tympanic membrane normal.  Mouth/Throat: No trismus in the jaw. Oropharyngeal exudate, posterior oropharyngeal edema and posterior oropharyngeal erythema present. No tonsillar abscesses. Tonsils are 3+ on the right. Tonsils are 3+ on the left. Tonsillar exudate.  Eyes: Pupils are equal,  round, and reactive to light. Conjunctivae are normal. Right eye exhibits no discharge. Left eye exhibits no discharge. No scleral icterus.  Neck: Normal range of motion and full passive range of motion without pain. Neck supple. No neck rigidity. No thyromegaly present.  Cardiovascular: Normal rate, regular rhythm, normal heart sounds and intact distal pulses. Exam reveals no gallop and no friction rub.  No murmur heard. Pulmonary/Chest: Effort normal and breath sounds normal. No stridor. No respiratory distress. She has  no wheezes. She has no rales.  Abdominal: Soft. Bowel sounds are normal. She exhibits no distension. There is no tenderness. There is no rebound and no guarding.  Musculoskeletal: She exhibits no edema.  Lymphadenopathy:    She has no cervical adenopathy.  Neurological: She is alert. Coordination normal.  Skin: Skin is warm and dry. No rash noted. She is not diaphoretic. No pallor.  Psychiatric: She has a normal mood and affect.  Nursing note and vitals reviewed.    ED Treatments / Results  Labs (all labs ordered are listed, but only abnormal results are displayed) Labs Reviewed  GROUP A STREP BY PCR - Abnormal; Notable for the following components:      Result Value   Group A Strep by PCR DETECTED (*)    All other components within normal limits  COMPREHENSIVE METABOLIC PANEL - Abnormal; Notable for the following components:   Glucose, Bld 135 (*)    Creatinine, Ser 1.25 (*)    GFR calc non Af Amer 41 (*)    GFR calc Af Amer 48 (*)    All other components within normal limits  CBC WITH DIFFERENTIAL/PLATELET - Abnormal; Notable for the following components:   WBC 11.9 (*)    Hemoglobin 11.2 (*)    Neutro Abs 8.1 (*)    All other components within normal limits  PROTIME-INR  I-STAT CG4 LACTIC ACID, ED    EKG None  Radiology No results found.  Procedures Procedures (including critical care time)  Medications Ordered in ED Medications  acetaminophen (TYLENOL) tablet 650 mg (650 mg Oral Given 07/10/18 1330)  penicillin g procaine-penicillin g benzathine (BICILLIN-CR) injection 600000-600000 units (1.2 Million Units Intramuscular Given 07/10/18 1542)     Initial Impression / Assessment and Plan / ED Course  I have reviewed the triage vital signs and the nursing notes.  Pertinent labs & imaging results that were available during my care of the patient were reviewed by me and considered in my medical decision making (see chart for details).     Patient with strep  throat.  Her fever was treated with Tylenol in the ED.  Labs are within normal limits for the patient, except for mild leukocytosis, 11.9.  Lactic acid is negative.  Patient opted for single dose of Bicillin.  Advised Tylenol and ibuprofen alternating for fever, sore throat, and body aches.  Recheck at PCP in 3 to 4 days.  Strict return precautions discussed.  Patient understands and agrees with plan.  Patient also evaluated by Dr. Johnney Killian who agrees with plan.  Patient vitals stable and discharged in satisfactory condition.  Final Clinical Impressions(s) / ED Diagnoses   Final diagnoses:  Strep pharyngitis    ED Discharge Orders    None       Frederica Kuster, PA-C 07/11/18 0915    Frederica Kuster, PA-C 07/11/18 0915    Charlesetta Shanks, MD 07/11/18 [phone]

## 2018-07-14 ENCOUNTER — Telehealth: Payer: Self-pay | Admitting: Internal Medicine

## 2018-07-14 NOTE — Telephone Encounter (Signed)
This was only a phone note

## 2018-07-15 ENCOUNTER — Telehealth: Payer: Self-pay | Admitting: Internal Medicine

## 2018-07-15 NOTE — Telephone Encounter (Signed)
Spoke to patient and informed of labs she would like a call back from you because she states that she had this last year and the health dep. Gave her two shots I asked if they re-tested her ? She stated no. And that she is not sexually active so she doesn't know where this is coming from and would like a call back from you to discuss.

## 2018-07-15 NOTE — Telephone Encounter (Signed)
I called pt but she did not answer.

## 2018-07-15 NOTE — Telephone Encounter (Signed)
I sent you a note via [employer] about her + Syphilis results and other labs. Thanks.

## 2018-07-16 NOTE — Telephone Encounter (Signed)
I spoke with pt this am and answered all her questions. She believes she contracted this 3 years ago with a new partner.  She also informed med she ended up with strep throat   And was given [facility] shot. She will go Monday to the [facility].Since then she has been having neck stiffness, but no fever. I advised her to use ice and heat on neck area for 10 min each and do neck stretches after the heat. If she gets a fever or worse pain, should go to the ER.

## 2018-07-22 ENCOUNTER — Other Ambulatory Visit: Payer: Self-pay | Admitting: Nurse Practitioner

## 2018-07-28 ENCOUNTER — Other Ambulatory Visit: Payer: Self-pay | Admitting: Nurse Practitioner

## 2018-07-29 ENCOUNTER — Other Ambulatory Visit: Payer: Self-pay | Admitting: Nurse Practitioner

## 2018-08-02 ENCOUNTER — Other Ambulatory Visit: Payer: Self-pay | Admitting: Nurse Practitioner

## 2018-08-06 ENCOUNTER — Ambulatory Visit: Payer: Self-pay | Admitting: Nurse Practitioner

## 2018-10-14 ENCOUNTER — Other Ambulatory Visit: Payer: Self-pay | Admitting: Internal Medicine

## 2018-10-20 ENCOUNTER — Other Ambulatory Visit: Payer: Self-pay | Admitting: Nurse Practitioner

## 2018-11-02 ENCOUNTER — Telehealth: Payer: Self-pay | Admitting: Internal Medicine

## 2018-11-02 ENCOUNTER — Other Ambulatory Visit: Payer: Self-pay | Admitting: Internal Medicine

## 2018-11-02 NOTE — Telephone Encounter (Signed)
I called Kristen Cannon and they could not find her in their system.

## 2018-11-08 ENCOUNTER — Other Ambulatory Visit: Payer: Self-pay | Admitting: Nurse Practitioner

## 2018-11-10 ENCOUNTER — Ambulatory Visit (INDEPENDENT_AMBULATORY_CARE_PROVIDER_SITE_OTHER): Payer: Medicare Other | Admitting: Internal Medicine

## 2018-11-10 ENCOUNTER — Encounter: Payer: Self-pay | Admitting: Nurse Practitioner

## 2018-11-10 VITALS — BP 160/100 | HR 79 | Temp 98.4°F | Ht 64.2 in | Wt 176.6 lb

## 2018-11-10 DIAGNOSIS — N183 Chronic kidney disease, stage 3 unspecified: Secondary | ICD-10-CM

## 2018-11-10 DIAGNOSIS — E1122 Type 2 diabetes mellitus with diabetic chronic kidney disease: Secondary | ICD-10-CM

## 2018-11-10 DIAGNOSIS — I129 Hypertensive chronic kidney disease with stage 1 through stage 4 chronic kidney disease, or unspecified chronic kidney disease: Secondary | ICD-10-CM

## 2018-11-10 DIAGNOSIS — E7800 Pure hypercholesterolemia, unspecified: Secondary | ICD-10-CM | POA: Diagnosis not present

## 2018-11-10 DIAGNOSIS — Z6830 Body mass index (BMI) 30.0-30.9, adult: Secondary | ICD-10-CM

## 2018-11-10 DIAGNOSIS — E6609 Other obesity due to excess calories: Secondary | ICD-10-CM

## 2018-11-10 NOTE — Patient Instructions (Signed)

## 2018-11-11 ENCOUNTER — Encounter: Payer: Self-pay | Admitting: Internal Medicine

## 2018-11-11 DIAGNOSIS — Z6836 Body mass index (BMI) 36.0-36.9, adult: Secondary | ICD-10-CM | POA: Insufficient documentation

## 2018-11-11 DIAGNOSIS — E6609 Other obesity due to excess calories: Secondary | ICD-10-CM | POA: Insufficient documentation

## 2018-11-11 DIAGNOSIS — E1122 Type 2 diabetes mellitus with diabetic chronic kidney disease: Secondary | ICD-10-CM | POA: Insufficient documentation

## 2018-11-11 DIAGNOSIS — I129 Hypertensive chronic kidney disease with stage 1 through stage 4 chronic kidney disease, or unspecified chronic kidney disease: Secondary | ICD-10-CM | POA: Insufficient documentation

## 2018-11-11 DIAGNOSIS — N1831 Chronic kidney disease, stage 3a: Secondary | ICD-10-CM | POA: Insufficient documentation

## 2018-11-11 DIAGNOSIS — N183 Chronic kidney disease, stage 3 unspecified: Secondary | ICD-10-CM | POA: Insufficient documentation

## 2018-11-11 DIAGNOSIS — Z6830 Body mass index (BMI) 30.0-30.9, adult: Secondary | ICD-10-CM

## 2018-11-11 DIAGNOSIS — E7800 Pure hypercholesterolemia, unspecified: Secondary | ICD-10-CM | POA: Insufficient documentation

## 2018-11-11 LAB — CMP14+EGFR
ALT: 26 IU/L (ref 0–32)
AST: 27 IU/L (ref 0–40)
Albumin/Globulin Ratio: 1.4 (ref 1.2–2.2)
Albumin: 4.1 g/dL (ref 3.7–4.7)
Alkaline Phosphatase: 70 IU/L (ref 39–117)
BUN/Creatinine Ratio: 13 (ref 12–28)
BUN: 18 mg/dL (ref 8–27)
Bilirubin Total: 0.2 mg/dL (ref 0.0–1.2)
CO2: 24 mmol/L (ref 20–29)
Calcium: 10.1 mg/dL (ref 8.7–10.3)
Chloride: 106 mmol/L (ref 96–106)
Creatinine, Ser: 1.41 mg/dL — ABNORMAL HIGH (ref 0.57–1.00)
GFR calc Af Amer: 42 mL/min/{1.73_m2} — ABNORMAL LOW (ref >59)
GFR calc non Af Amer: 37 mL/min/{1.73_m2} — ABNORMAL LOW (ref >59)
Globulin, Total: 2.9 g/dL (ref 1.5–4.5)
Glucose: 131 mg/dL — ABNORMAL HIGH (ref 65–99)
Potassium: 4 mmol/L (ref 3.5–5.2)
Sodium: 146 mmol/L — ABNORMAL HIGH (ref 134–144)
Total Protein: 7 g/dL (ref 6.0–8.5)

## 2018-11-11 LAB — HEMOGLOBIN A1C
Est. average glucose Bld gHb Est-mCnc: 151 mg/dL
Hgb A1c MFr Bld: 6.9 % — ABNORMAL HIGH (ref 4.8–5.6)

## 2018-11-11 NOTE — Progress Notes (Signed)
Subjective:     Patient ID: Kristen Cannon , female    DOB: 1946-09-30 , 73 y.o.   MRN: [id number]   Chief Complaint  Patient presents with  . Diabetes  . Hypertension    HPI  Diabetes  She presents for her follow-up diabetic visit. She has type 2 diabetes mellitus. Her disease course has been stable. There are no hypoglycemic associated symptoms. Pertinent negatives for diabetes include no blurred vision and no chest pain. There are no hypoglycemic complications. Risk factors for coronary artery disease include dyslipidemia, diabetes mellitus, hypertension, obesity, sedentary lifestyle and post-menopausal. She never participates in exercise.  Hypertension  This is a chronic problem. The current episode started more than 1 year ago. The problem has been gradually improving since onset. The problem is controlled. Pertinent negatives include no blurred vision, chest pain, palpitations or shortness of breath.   She admits she did not take all of her meds today.   Past Medical History:  Diagnosis Date  . Diabetes mellitus   . High cholesterol   . Hypertension      Family History  Problem Relation Age of Onset  . Breast cancer Sister   . Breast cancer Daughter      Current Outpatient Medications:  .  aspirin EC 81 MG tablet, Take 81 mg by mouth every morning., Disp: , Rfl:  .  atorvastatin (LIPITOR) 20 MG tablet, TAKE 1 TABLET BY MOUTH ONCE DAILY, Disp: 90 tablet, Rfl: 0 .  cloNIDine (CATAPRES) 0.2 MG tablet, TAKE 1 TABLET BY MOUTH EVERY DAY, Disp: 90 tablet, Rfl: 0 .  glipiZIDE (GLUCOTROL) 10 MG tablet, TAKE 1 TABLET BY MOUTH EVERY MORNING BEFORE A MEAL AND BEFORE THE EVENING MEAL, Disp: 180 tablet, Rfl: 0 .  hydrochlorothiazide (HYDRODIURIL) 12.5 MG tablet, TAKE 1 TABLET BY MOUTH EVERY DAY, Disp: 90 tablet, Rfl: 1 .  hydrochlorothiazide (HYDRODIURIL) 25 MG tablet, Take 25 mg by mouth daily., Disp: , Rfl:  .  losartan (COZAAR) 50 MG tablet, TAKE 1 TABLET BY MOUTH TWICE  DAILY, Disp: 180 tablet, Rfl: 0 .  TRADJENTA 5 MG TABS tablet, TAKE 1 TABLET BY MOUTH DAILY, Disp: 90 tablet, Rfl: 0   No Known Allergies   Review of Systems  Constitutional: Negative.   Eyes: Negative for blurred vision.  Respiratory: Negative.  Negative for shortness of breath.   Cardiovascular: Negative.  Negative for chest pain and palpitations.  Gastrointestinal: Negative.   Neurological: Negative.   Psychiatric/Behavioral: Negative.      Today's Vitals   11/10/18 1102  BP: (!) 160/100  Pulse: 79  Temp: 98.4 F (36.9 C)  TempSrc: Oral  SpO2: 95%  Weight: 176 lb 9.6 oz (80.1 kg)  Height: 5' 4.2" (1.631 m)  PainSc: 0-No pain   Body mass index is 30.12 kg/m.   Objective:  Physical Exam Vitals signs and nursing note reviewed.  Constitutional:      Appearance: Normal appearance. She is obese.  HENT:     Head: Normocephalic.  Cardiovascular:     Rate and Rhythm: Normal rate and regular rhythm.     Heart sounds: Normal heart sounds.  Pulmonary:     Effort: Pulmonary effort is normal.     Breath sounds: Normal breath sounds.  Skin:    General: Skin is warm.  Neurological:     General: No focal deficit present.     Mental Status: She is alert and oriented to person, place, and time.  Psychiatric:  Mood and Affect: Mood normal.         Assessment And Plan:     1. Diabetes mellitus with stage 3 chronic kidney disease ([place])  I will check labs as listed below. Importance of dietary compliance was discussed with the patient. She is encouraged to stay well hydrated.   - CMP14+EGFR - Hemoglobin A1c  2. Hypertensive nephropathy  Terribly uncontrolled. Importance of medication compliance is stressed to the patient. She will rto next week for re-evaluation.   3. Pure hypercholesterolemia  I will check fasting lipid panel at her next visit. She is encouraged to avoid fried foods.   4. Class 1 obesity due to excess calories with serious comorbidity and  body mass index (BMI) of 30.0 to 30.9 in adult  She is encouraged to strive for BMI less than 28 to decrease cardiac risk. She is reminded that chair exercises count towards her exercise goal.   Maximino Greenland, MD

## 2018-11-17 ENCOUNTER — Encounter: Payer: Self-pay | Admitting: Internal Medicine

## 2018-11-17 ENCOUNTER — Ambulatory Visit (INDEPENDENT_AMBULATORY_CARE_PROVIDER_SITE_OTHER): Payer: Medicare Other | Admitting: Internal Medicine

## 2018-11-17 VITALS — BP 136/88 | HR 77 | Temp 97.9°F | Ht 64.2 in | Wt 172.2 lb

## 2018-11-17 DIAGNOSIS — I129 Hypertensive chronic kidney disease with stage 1 through stage 4 chronic kidney disease, or unspecified chronic kidney disease: Secondary | ICD-10-CM

## 2018-11-17 DIAGNOSIS — N183 Chronic kidney disease, stage 3 unspecified: Secondary | ICD-10-CM

## 2018-11-17 MED ORDER — LOSARTAN POTASSIUM 100 MG PO TABS: 100.0000 mg | ORAL_TABLET | Freq: Every day | ORAL | 2 refills | Status: DC

## 2018-11-20 NOTE — Progress Notes (Signed)
Subjective:     Patient ID: Kristen Cannon , female    DOB: July 25, 1946 , 73 y.o.   MRN: [id number]   Chief Complaint  Patient presents with  . Hypertension    HPI  She is here today for bp check. She had been taking losartan 50mg  twice daily prior to her last visit. She admits that she sometimes missed the second dose. Therefore, she was instructed to start taking losartan 50mg  - 2 tabs in the morning. She did not have any issues with this regimen.   Hypertension      Past Medical History:  Diagnosis Date  . Diabetes mellitus   . High cholesterol   . Hypertension      Family History  Problem Relation Age of Onset  . Breast cancer Sister   . Breast cancer Daughter   . Early death Mother   . Early death Father      Current Outpatient Medications:  .  aspirin EC 81 MG tablet, Take 81 mg by mouth every morning., Disp: , Rfl:  .  atorvastatin (LIPITOR) 20 MG tablet, TAKE 1 TABLET BY MOUTH ONCE DAILY, Disp: 90 tablet, Rfl: 0 .  glipiZIDE (GLUCOTROL) 10 MG tablet, TAKE 1 TABLET BY MOUTH EVERY MORNING BEFORE A MEAL AND BEFORE THE EVENING MEAL, Disp: 180 tablet, Rfl: 0 .  hydrochlorothiazide (HYDRODIURIL) 12.5 MG tablet, TAKE 1 TABLET BY MOUTH EVERY DAY, Disp: 90 tablet, Rfl: 1 .  TRADJENTA 5 MG TABS tablet, TAKE 1 TABLET BY MOUTH DAILY, Disp: 90 tablet, Rfl: 0 .  cloNIDine (CATAPRES) 0.2 MG tablet, TAKE 1 TABLET BY MOUTH EVERY DAY, Disp: 90 tablet, Rfl: 0 .  losartan (COZAAR) 100 MG tablet, Take 1 tablet (100 mg total) by mouth daily., Disp: 90 tablet, Rfl: 2   No Known Allergies   Review of Systems  Constitutional: Negative.   Respiratory: Negative.   Cardiovascular: Negative.   Gastrointestinal: Negative.   Neurological: Negative.   Psychiatric/Behavioral: Negative.      Today's Vitals   11/17/18 1555  BP: 136/88  Pulse: 77  Temp: 97.9 F (36.6 C)  TempSrc: Oral  Weight: 172 lb 3.2 oz (78.1 kg)  Height: 5' 4.2" (1.631 m)  PainSc: 0-No pain   Body  mass index is 29.37 kg/m.   Objective:  Physical Exam Vitals signs and nursing note reviewed.  Constitutional:      Appearance: Normal appearance.  HENT:     Head: Normocephalic and atraumatic.  Cardiovascular:     Rate and Rhythm: Normal rate and regular rhythm.     Heart sounds: Normal heart sounds.  Pulmonary:     Effort: Pulmonary effort is normal.     Breath sounds: Normal breath sounds.  Skin:    General: Skin is warm.  Neurological:     General: No focal deficit present.     Mental Status: She is alert.  Psychiatric:        Mood and Affect: Mood normal.         Assessment And Plan:     1. Hypertensive nephropathy  Improved control, not yet at goal. Pt is aware that optimal bp is less than 130/80. She will be given rx losartan 100mg  once daily.  She is encouraged to avoid adding salt to her foods and to increase her daily activity. She will rto in 3 months for re-evaluation.   2. Chronic renal disease, stage III ([place])  I reviewed her last labs from 1/29 in full detail during  her visit. She is encouraged to increase her water intake.   Maximino Greenland, MD

## 2018-11-29 ENCOUNTER — Other Ambulatory Visit: Payer: Self-pay | Admitting: Internal Medicine

## 2018-11-29 DIAGNOSIS — Z1231 Encounter for screening mammogram for malignant neoplasm of breast: Secondary | ICD-10-CM

## 2018-12-01 LAB — HM DIABETES EYE EXAM

## 2018-12-07 ENCOUNTER — Encounter: Payer: Self-pay | Admitting: Nurse Practitioner

## 2018-12-12 HISTORY — PX: CATARACT EXTRACTION: SUR2

## 2019-01-10 ENCOUNTER — Ambulatory Visit: Payer: Medicare Other

## 2019-01-10 ENCOUNTER — Other Ambulatory Visit: Payer: Self-pay | Admitting: Nurse Practitioner

## 2019-01-18 ENCOUNTER — Other Ambulatory Visit: Payer: Self-pay | Admitting: Internal Medicine

## 2019-01-18 ENCOUNTER — Other Ambulatory Visit: Payer: Self-pay | Admitting: Nurse Practitioner

## 2019-02-02 ENCOUNTER — Other Ambulatory Visit: Payer: Self-pay | Admitting: Nurse Practitioner

## 2019-02-08 ENCOUNTER — Other Ambulatory Visit: Payer: Self-pay | Admitting: Nurse Practitioner

## 2019-02-15 ENCOUNTER — Encounter: Payer: Self-pay | Admitting: Internal Medicine

## 2019-02-15 ENCOUNTER — Ambulatory Visit (INDEPENDENT_AMBULATORY_CARE_PROVIDER_SITE_OTHER): Payer: Medicare Other | Admitting: Internal Medicine

## 2019-02-15 ENCOUNTER — Other Ambulatory Visit: Payer: Self-pay | Admitting: Internal Medicine

## 2019-02-15 ENCOUNTER — Other Ambulatory Visit: Payer: Self-pay

## 2019-02-15 VITALS — BP 110/74 | HR 92 | Temp 98.2°F | Wt 175.4 lb

## 2019-02-15 DIAGNOSIS — E1122 Type 2 diabetes mellitus with diabetic chronic kidney disease: Secondary | ICD-10-CM | POA: Diagnosis not present

## 2019-02-15 DIAGNOSIS — E663 Overweight: Secondary | ICD-10-CM

## 2019-02-15 DIAGNOSIS — I129 Hypertensive chronic kidney disease with stage 1 through stage 4 chronic kidney disease, or unspecified chronic kidney disease: Secondary | ICD-10-CM | POA: Diagnosis not present

## 2019-02-15 DIAGNOSIS — G4709 Other insomnia: Secondary | ICD-10-CM

## 2019-02-15 DIAGNOSIS — E7800 Pure hypercholesterolemia, unspecified: Secondary | ICD-10-CM

## 2019-02-15 DIAGNOSIS — N183 Chronic kidney disease, stage 3 unspecified: Secondary | ICD-10-CM

## 2019-02-15 DIAGNOSIS — Z79899 Other long term (current) drug therapy: Secondary | ICD-10-CM

## 2019-02-15 MED ORDER — MELATONIN 3 MG PO CAPS: 3.0000 mg | ORAL_CAPSULE | Freq: Every evening | ORAL | 1 refills | Status: DC | PRN

## 2019-02-15 MED ORDER — LOSARTAN POTASSIUM-HCTZ 100-12.5 MG PO TABS: 1.0000 | ORAL_TABLET | Freq: Every day | ORAL | 1 refills | Status: DC

## 2019-02-16 LAB — VITAMIN B12: Vitamin B-12: 1008 pg/mL (ref 232–1245)

## 2019-02-16 LAB — CBC WITH DIFFERENTIAL/PLATELET
Basophils Absolute: 0 10*3/uL (ref 0.0–0.2)
Basos: 0 %
EOS (ABSOLUTE): 0.2 10*3/uL (ref 0.0–0.4)
Eos: 4 %
Hematocrit: 36.3 % (ref 34.0–46.6)
Hemoglobin: 11.8 g/dL (ref 11.1–15.9)
Immature Grans (Abs): 0 10*3/uL (ref 0.0–0.1)
Immature Granulocytes: 0 %
Lymphocytes Absolute: 2.6 10*3/uL (ref 0.7–3.1)
Lymphs: 47 %
MCH: 28.4 pg (ref 26.6–33.0)
MCHC: 32.5 g/dL (ref 31.5–35.7)
MCV: 88 fL (ref 79–97)
Monocytes Absolute: 0.5 10*3/uL (ref 0.1–0.9)
Monocytes: 9 %
Neutrophils Absolute: 2.3 10*3/uL (ref 1.4–7.0)
Neutrophils: 40 %
Platelets: 292 10*3/uL (ref 150–450)
RBC: 4.15 x10E6/uL (ref 3.77–5.28)
RDW: 13.6 % (ref 11.7–15.4)
WBC: 5.6 10*3/uL (ref 3.4–10.8)

## 2019-02-16 LAB — HEMOGLOBIN A1C
Est. average glucose Bld gHb Est-mCnc: 151 mg/dL
Hgb A1c MFr Bld: 6.9 % — ABNORMAL HIGH (ref 4.8–5.6)

## 2019-02-16 LAB — CMP14+EGFR
ALT: 25 IU/L (ref 0–32)
AST: 27 IU/L (ref 0–40)
Albumin/Globulin Ratio: 1.2 (ref 1.2–2.2)
Albumin: 4.1 g/dL (ref 3.7–4.7)
Alkaline Phosphatase: 79 IU/L (ref 39–117)
BUN/Creatinine Ratio: 16 (ref 12–28)
BUN: 19 mg/dL (ref 8–27)
Bilirubin Total: 0.2 mg/dL (ref 0.0–1.2)
CO2: 26 mmol/L (ref 20–29)
Calcium: 10.2 mg/dL (ref 8.7–10.3)
Chloride: 102 mmol/L (ref 96–106)
Creatinine, Ser: 1.2 mg/dL — ABNORMAL HIGH (ref 0.57–1.00)
GFR calc Af Amer: 51 mL/min/{1.73_m2} — ABNORMAL LOW (ref >59)
GFR calc non Af Amer: 45 mL/min/{1.73_m2} — ABNORMAL LOW (ref >59)
Globulin, Total: 3.5 g/dL (ref 1.5–4.5)
Glucose: 120 mg/dL — ABNORMAL HIGH (ref 65–99)
Potassium: 3.5 mmol/L (ref 3.5–5.2)
Sodium: 145 mmol/L — ABNORMAL HIGH (ref 134–144)
Total Protein: 7.6 g/dL (ref 6.0–8.5)

## 2019-02-16 LAB — PROTEIN ELECTROPHORESIS, SERUM
A/G Ratio: 1.1 (ref 0.7–1.7)
Albumin ELP: 3.9 g/dL (ref 2.9–4.4)
Alpha 1: 0.2 g/dL (ref 0.0–0.4)
Alpha 2: 0.8 g/dL (ref 0.4–1.0)
Beta: 1.2 g/dL (ref 0.7–1.3)
Gamma Globulin: 1.5 g/dL (ref 0.4–1.8)
Globulin, Total: 3.7 g/dL (ref 2.2–3.9)

## 2019-02-16 LAB — PHOSPHORUS: Phosphorus: 3.2 mg/dL (ref 3.0–4.3)

## 2019-02-16 LAB — LIPID PANEL
Chol/HDL Ratio: 2.4 ratio (ref 0.0–4.4)
Cholesterol, Total: 146 mg/dL (ref 100–199)
HDL: 62 mg/dL (ref >39)
LDL Calculated: 60 mg/dL (ref 0–99)
Triglycerides: 121 mg/dL (ref 0–149)
VLDL Cholesterol Cal: 24 mg/dL (ref 5–40)

## 2019-02-18 ENCOUNTER — Telehealth: Payer: Self-pay

## 2019-02-18 NOTE — Telephone Encounter (Signed)
Left the patient a message to call back for lab results. 

## 2019-02-18 NOTE — Telephone Encounter (Signed)
-----   Message from Glendale Chard, MD sent at 02/17/2019  6:13 PM EDT ----- Your sodium level is slightly elevated - it appears you are not drinking enough water. Your liver fxn is nl. Your blood count is nl. Cholesterol is great. a1c is 6.9, not bad.

## 2019-03-07 NOTE — Progress Notes (Signed)
Subjective:     Patient ID: Kristen Cannon , female    DOB: May 15, 1946 , 73 y.o.   MRN: [id number]   Chief Complaint  Patient presents with  . Diabetes    diabetes foot exam  . Hypertension    HPI  Diabetes  She presents for her follow-up diabetic visit. She has type 2 diabetes mellitus. Her disease course has been stable. There are no hypoglycemic associated symptoms. Pertinent negatives for diabetes include no blurred vision and no chest pain. There are no hypoglycemic complications. Risk factors for coronary artery disease include dyslipidemia, diabetes mellitus, hypertension, obesity, sedentary lifestyle and post-menopausal. She is compliant with treatment most of the time. She is following a diabetic diet. She never participates in exercise.  Hypertension  This is a chronic problem. The current episode started more than 1 year ago. The problem has been gradually improving since onset. The problem is controlled. Pertinent negatives include no blurred vision, chest pain, palpitations or shortness of breath.     Past Medical History:  Diagnosis Date  . Diabetes mellitus   . High cholesterol   . Hypertension      Family History  Problem Relation Age of Onset  . Breast cancer Sister   . Breast cancer Daughter   . Early death Mother   . Early death Father      Current Outpatient Medications:  .  aspirin EC 81 MG tablet, Take 81 mg by mouth every morning., Disp: , Rfl:  .  atorvastatin (LIPITOR) 20 MG tablet, TAKE 1 TABLET BY MOUTH ONCE DAILY, Disp: 90 tablet, Rfl: 0 .  cloNIDine (CATAPRES) 0.2 MG tablet, TAKE 1 TABLET BY MOUTH EVERY DAY, Disp: 90 tablet, Rfl: 0 .  glipiZIDE (GLUCOTROL) 10 MG tablet, TAKE 1 TABLET BY MOUTH EVERY MORNING BEFORE A MEAL AND BEFORE THE EVENING MEAL, Disp: 180 tablet, Rfl: 0 .  hydrochlorothiazide (HYDRODIURIL) 12.5 MG tablet, TAKE 1 TABLET BY MOUTH EVERY DAY, Disp: 90 tablet, Rfl: 1 .  losartan (COZAAR) 100 MG tablet, Take 1 tablet (100  mg total) by mouth daily., Disp: 90 tablet, Rfl: 2 .  TRADJENTA 5 MG TABS tablet, TAKE 1 TABLET BY MOUTH DAILY, Disp: 90 tablet, Rfl: 0 .  losartan-hydrochlorothiazide (HYZAAR) 100-12.5 MG tablet, Take 1 tablet by mouth daily., Disp: 90 tablet, Rfl: 1 .  Melatonin 3 MG CAPS, Take 1 capsule (3 mg total) by mouth at bedtime as needed., Disp: 30 capsule, Rfl: 1   No Known Allergies   Review of Systems  Constitutional: Negative.   Eyes: Negative for blurred vision.  Respiratory: Negative.  Negative for shortness of breath.   Cardiovascular: Negative.  Negative for chest pain and palpitations.  Gastrointestinal: Negative.   Neurological: Negative.   Psychiatric/Behavioral: Negative.      Today's Vitals   02/15/19 1106  BP: 110/74  Pulse: 92  Temp: 98.2 F (36.8 C)  TempSrc: Oral  Weight: 175 lb 6.4 oz (79.6 kg)  PainSc: 0-No pain   Body mass index is 29.92 kg/m.   Objective:  Physical Exam Vitals signs and nursing note reviewed.  Constitutional:      Appearance: Normal appearance.  HENT:     Head: Normocephalic and atraumatic.  Cardiovascular:     Rate and Rhythm: Normal rate and regular rhythm.     Pulses:          Dorsalis pedis pulses are 2+ on the right side and 2+ on the left side.     Heart sounds:  Normal heart sounds.  Pulmonary:     Effort: Pulmonary effort is normal.     Breath sounds: Normal breath sounds.  Feet:     Right foot:     Protective Sensation: 5 sites tested. 5 sites sensed.     Left foot:     Protective Sensation: 5 sites tested. 5 sites sensed.  Skin:    General: Skin is warm.  Neurological:     General: No focal deficit present.     Mental Status: She is alert.  Psychiatric:        Mood and Affect: Mood normal.        Behavior: Behavior normal.         Assessment And Plan:     1. Diabetes mellitus with stage 3 chronic kidney disease ([place])  I will check labs as listed below. Importance of dietary compliance was discussed with the  patient. I will adjust meds after reviewing her labs. She is encouraged to incorporate more exercise into her daily routine.   - Protein electrophoresis, serum - Phosphorus - Lipid panel - CMP14+EGFR - Hemoglobin A1c - CBC with Diff  2. Hypertensive nephropathy  Well controlled. She will continue with current meds. She is encouraged to avoid adding salt to her foods. She is reminded that cheeses and breads tend to have high sodium levels as well.   3. Pure hypercholesterolemia  I will check lipid panel and LFTS. She is encouraged to avoid fried foods, incorporate more exercise into her daily routine and to increase her fiber intake.   4. Other insomnia  Chronic. Importance of good bedtime hygiene was discussed with the patient. She is encouraged to develop a bedtime routine and to avoid electronics for at least one hour prior to going to bed. She is advised to start magnesium nightly.   5. Drug therapy  - Vitamin B12  6. Overweight with body mass index (BMI) 25.0-29.9  She is encouraged to lose 7-10 pounds to decrease cardiac risk.   Maximino Greenland, MD    THE PATIENT IS ENCOURAGED TO PRACTICE SOCIAL DISTANCING DUE TO THE COVID-19 PANDEMIC.

## 2019-03-16 ENCOUNTER — Telehealth: Payer: Self-pay

## 2019-03-16 NOTE — Telephone Encounter (Signed)
Left message for pt----need to clarify what medication she picked up from the pharmacy and the dosage (losartan and htcz)

## 2019-03-18 ENCOUNTER — Other Ambulatory Visit: Payer: Self-pay | Admitting: Nurse Practitioner

## 2019-03-21 ENCOUNTER — Other Ambulatory Visit: Payer: Self-pay | Admitting: Nurse Practitioner

## 2019-03-21 MED ORDER — VITAMIN D (ERGOCALCIFEROL) 1.25 MG (50000 UNIT) PO CAPS: 50000.0000 [IU] | ORAL_CAPSULE | ORAL | 0 refills | Status: DC

## 2019-03-22 ENCOUNTER — Other Ambulatory Visit: Payer: Self-pay

## 2019-04-04 ENCOUNTER — Other Ambulatory Visit: Payer: Self-pay | Admitting: Nurse Practitioner

## 2019-04-12 ENCOUNTER — Ambulatory Visit
Admission: RE | Admit: 2019-04-12 | Discharge: 2019-04-12 | Disposition: A | Discharge status: Home / Self Care | Payer: Medicare Other | Source: Ambulatory Visit | Attending: Internal Medicine | Admitting: Internal Medicine

## 2019-04-12 ENCOUNTER — Other Ambulatory Visit: Payer: Self-pay

## 2019-04-12 DIAGNOSIS — Z1231 Encounter for screening mammogram for malignant neoplasm of breast: Secondary | ICD-10-CM

## 2019-05-04 ENCOUNTER — Other Ambulatory Visit: Payer: Self-pay | Admitting: Internal Medicine

## 2019-05-23 ENCOUNTER — Other Ambulatory Visit: Payer: Self-pay | Admitting: Internal Medicine

## 2019-05-26 ENCOUNTER — Telehealth: Payer: Self-pay

## 2019-05-26 NOTE — Telephone Encounter (Signed)
Nurse from [facility] called stating pt needs to be scheduled for a AWV and I have called the pt to notify her that she has an appointment already scheduled for 07/14/19. YRL,RMA

## 2019-05-29 LAB — FECAL OCCULT BLOOD, IMMUNOCHEMICAL: IFOBT: NEGATIVE

## 2019-06-21 ENCOUNTER — Other Ambulatory Visit: Payer: Self-pay

## 2019-06-21 ENCOUNTER — Other Ambulatory Visit: Payer: Self-pay | Admitting: Nurse Practitioner

## 2019-06-21 MED ORDER — GLIPIZIDE 10 MG PO TABS: ORAL_TABLET | ORAL | 1 refills | Status: DC

## 2019-07-12 ENCOUNTER — Other Ambulatory Visit: Payer: Self-pay | Admitting: Nurse Practitioner

## 2019-07-14 ENCOUNTER — Ambulatory Visit (INDEPENDENT_AMBULATORY_CARE_PROVIDER_SITE_OTHER): Payer: Medicare Other | Admitting: Nurse Practitioner

## 2019-07-14 ENCOUNTER — Other Ambulatory Visit: Payer: Self-pay

## 2019-07-14 ENCOUNTER — Ambulatory Visit (INDEPENDENT_AMBULATORY_CARE_PROVIDER_SITE_OTHER): Payer: Medicare Other

## 2019-07-14 ENCOUNTER — Encounter: Payer: Self-pay | Admitting: Nurse Practitioner

## 2019-07-14 VITALS — BP 130/82 | HR 83 | Temp 98.6°F | Ht 60.8 in | Wt 174.0 lb

## 2019-07-14 DIAGNOSIS — Z0000 Encounter for general adult medical examination without abnormal findings: Secondary | ICD-10-CM

## 2019-07-14 DIAGNOSIS — N183 Chronic kidney disease, stage 3 unspecified: Secondary | ICD-10-CM

## 2019-07-14 DIAGNOSIS — Z23 Encounter for immunization: Secondary | ICD-10-CM

## 2019-07-14 DIAGNOSIS — E1122 Type 2 diabetes mellitus with diabetic chronic kidney disease: Secondary | ICD-10-CM

## 2019-07-14 DIAGNOSIS — I129 Hypertensive chronic kidney disease with stage 1 through stage 4 chronic kidney disease, or unspecified chronic kidney disease: Secondary | ICD-10-CM

## 2019-07-14 DIAGNOSIS — E6609 Other obesity due to excess calories: Secondary | ICD-10-CM

## 2019-07-14 DIAGNOSIS — Z6830 Body mass index (BMI) 30.0-30.9, adult: Secondary | ICD-10-CM

## 2019-07-14 DIAGNOSIS — E7800 Pure hypercholesterolemia, unspecified: Secondary | ICD-10-CM

## 2019-07-14 LAB — POCT URINALYSIS DIPSTICK
Bilirubin, UA: NEGATIVE
Blood, UA: NEGATIVE
Glucose, UA: NEGATIVE
Ketones, UA: NEGATIVE
Leukocytes, UA: NEGATIVE
Nitrite, UA: NEGATIVE
Protein, UA: NEGATIVE
Spec Grav, UA: 1.025 (ref 1.010–1.025)
Urobilinogen, UA: 0.2 E.U./dL
pH, UA: 5.5 (ref 5.0–8.0)

## 2019-07-14 LAB — POCT UA - MICROALBUMIN
Albumin/Creatinine Ratio, Urine, POC: <30
Creatinine, POC: 300 mg/dL
Microalbumin Ur, POC: 30 mg/L

## 2019-07-14 NOTE — Patient Instructions (Signed)
Health Maintenance After Age 75 After age 39, you are at a higher risk for certain long-term diseases and infections as well as injuries from falls. Falls are a major cause of broken bones and head injuries in people who are older than age 66. Getting regular preventive care can help to keep you healthy and well. Preventive care includes getting regular testing and making lifestyle changes as recommended by your health care provider. Talk with your health care provider about:  Which screenings and tests you should have. A screening is a test that checks for a disease when you have no symptoms.  A diet and exercise plan that is right for you. What should I know about screenings and tests to prevent falls? Screening and testing are the best ways to find a health problem early. Early diagnosis and treatment give you the best chance of managing medical conditions that are common after age 8. Certain conditions and lifestyle choices may make you more likely to have a fall. Your health care provider may recommend:  Regular vision checks. Poor vision and conditions such as cataracts can make you more likely to have a fall. If you wear glasses, make sure to get your prescription updated if your vision changes.  Medicine review. Work with your health care provider to regularly review all of the medicines you are taking, including over-the-counter medicines. Ask your health care provider about any side effects that may make you more likely to have a fall. Tell your health care provider if any medicines that you take make you feel dizzy or sleepy.  Osteoporosis screening. Osteoporosis is a condition that causes the bones to get weaker. This can make the bones weak and cause them to break more easily.  Blood pressure screening. Blood pressure changes and medicines to control blood pressure can make you feel dizzy.  Strength and balance checks. Your health care provider may recommend certain tests to check your  strength and balance while standing, walking, or changing positions.  Foot health exam. Foot pain and numbness, as well as not wearing proper footwear, can make you more likely to have a fall.  Depression screening. You may be more likely to have a fall if you have a fear of falling, feel emotionally low, or feel unable to do activities that you used to do.  Alcohol use screening. Using too much alcohol can affect your balance and may make you more likely to have a fall. What actions can I take to lower my risk of falls? General instructions  Talk with your health care provider about your risks for falling. Tell your health care provider if: ? You fall. Be sure to tell your health care provider about all falls, even ones that seem minor. ? You feel dizzy, sleepy, or off-balance.  Take over-the-counter and prescription medicines only as told by your health care provider. These include any supplements.  Eat a healthy diet and maintain a healthy weight. A healthy diet includes low-fat dairy products, low-fat (lean) meats, and fiber from whole grains, beans, and lots of fruits and vegetables. Home safety  Remove any tripping hazards, such as rugs, cords, and clutter.  Install safety equipment such as grab bars in bathrooms and safety rails on stairs.  Keep rooms and walkways well-lit. Activity   Follow a regular exercise program to stay fit. This will help you maintain your balance. Ask your health care provider what types of exercise are appropriate for you.  If you need a cane or  walker, use it as recommended by your health care provider.  Wear supportive shoes that have nonskid soles. Lifestyle  Do not drink alcohol if your health care provider tells you not to drink.  If you drink alcohol, limit how much you have: ? 0-1 drink a day for women. ? 0-2 drinks a day for men.  Be aware of how much alcohol is in your drink. In the [place] one drink equals one typical bottle of beer (12  oz), one-half glass of wine (5 oz), or one shot of hard liquor (1 oz).  Do not use any products that contain nicotine or tobacco, such as cigarettes and e-cigarettes. If you need help quitting, ask your health care provider. Summary  Having a healthy lifestyle and getting preventive care can help to protect your health and wellness after age 53.  Screening and testing are the best way to find a health problem early and help you avoid having a fall. Early diagnosis and treatment give you the best chance for managing medical conditions that are more common for people who are older than age 11.  Falls are a major cause of broken bones and head injuries in people who are older than age 58. Take precautions to prevent a fall at home.  Work with your health care provider to learn what changes you can make to improve your health and wellness and to prevent falls. This information is not intended to replace advice given to you by your health care provider. Make sure you discuss any questions you have with your health care provider. Document Released: 08/12/2017 Document Revised: 01/20/2019 Document Reviewed: 08/12/2017 Elsevier Patient Education  [employer].   Exercises To Do While Sitting  Exercises that you do while sitting (chair exercises) can give you many of the same benefits as full exercise. Benefits include strengthening your heart, burning calories, and keeping muscles and joints healthy. Exercise can also improve your mood and help with depression and anxiety. You may benefit from chair exercises if you are unable to do standing exercises because of:  Diabetic foot pain.  Obesity.  Illness.  Arthritis.  Recovery from surgery or injury.  Breathing problems.  Balance problems.  Another type of disability. Before starting chair exercises, check with your health care provider or a physical therapist to find out how much exercise you can tolerate and which exercises are  safe for you. If your health care provider approves:  Start out slowly and build up over time. Aim to work up to about 10-20 minutes for each exercise session.  Make exercise part of your daily routine.  Drink water when you exercise. Do not wait until you are thirsty. Drink every 10-15 minutes.  Stop exercising right away if you have pain, nausea, shortness of breath, or dizziness.  If you are exercising in a wheelchair, make sure to lock the wheels.  Ask your health care provider whether you can do tai chi or yoga. Many positions in these mind-body exercises can be modified to do while seated. Warm-up Before starting other exercises: 1. Sit up as straight as you can. Have your knees bent at 90 degrees, which is the shape of the capital letter "L." Keep your feet flat on the floor. 2. Sit at the front edge of your chair, if you can. 3. Pull in (tighten) the muscles in your abdomen and stretch your spine and neck as straight as you can. Hold this position for a few minutes. 4. Breathe in and  out evenly. Try to concentrate on your breathing, and relax your mind. Stretching Exercise A: Arm stretch 1. Hold your arms out straight in front of your body. 2. Bend your hands at the wrist with your fingers pointing up, as if signaling someone to stop. Notice the slight tension in your forearms as you hold the position. 3. Keeping your arms out and your hands bent, rotate your hands outward as far as you can and hold this stretch. Aim to have your thumbs pointing up and your pinkie fingers pointing down. Slowly repeat arm stretches for one minute as tolerated. Exercise B: Leg stretch 1. If you can move your legs, try to "draw" letters on the floor with the toes of your foot. Write your name with one foot. 2. Write your name with the toes of your other foot. Slowly repeat the movements for one minute as tolerated. Exercise C: Reach for the sky 1. Reach your hands as far over your head as you can  to stretch your spine. 2. Move your hands and arms as if you are climbing a rope. Slowly repeat the movements for one minute as tolerated. Range of motion exercises Exercise A: Shoulder roll 1. Let your arms hang loosely at your sides. 2. Lift just your shoulders up toward your ears, then let them relax back down. 3. When your shoulders feel loose, rotate your shoulders in backward and forward circles. Do shoulder rolls slowly for one minute as tolerated. Exercise B: March in place 1. As if you are marching, pump your arms and lift your legs up and down. Lift your knees as high as you can. ? If you are unable to lift your knees, just pump your arms and move your ankles and feet up and down. March in place for one minute as tolerated. Exercise C: Seated jumping jacks 1. Let your arms hang down straight. 2. Keeping your arms straight, lift them up over your head. Aim to point your fingers to the ceiling. 3. While you lift your arms, straighten your legs and slide your heels along the floor to your sides, as wide as you can. 4. As you bring your arms back down to your sides, slide your legs back together. ? If you are unable to use your legs, just move your arms. Slowly repeat seated jumping jacks for one minute as tolerated. Strengthening exercises Exercise A: Shoulder squeeze 1. Hold your arms straight out from your body to your sides, with your elbows bent and your fists pointed at the ceiling. 2. Keeping your arms in the bent position, move them forward so your elbows and forearms meet in front of your face. 3. Open your arms back out as wide as you can with your elbows still bent, until you feel your shoulder blades squeezing together. Hold for 5 seconds. Slowly repeat the movements forward and backward for one minute as tolerated. Contact a health care provider if you:  Had to stop exercising due to any of the following: ? Pain. ? Nausea. ? Shortness of breath. ? Dizziness. ?  Fatigue.  Have significant pain or soreness after exercising. Get help right away if you have:  Chest pain.  Difficulty breathing. These symptoms may represent a serious problem that is an emergency. Do not wait to see if the symptoms will go away. Get medical help right away. Call your local emergency services (911 in the [place] Do not drive yourself to the hospital. This information is not intended to replace advice given  to you by your health care provider. Make sure you discuss any questions you have with your health care provider. Document Released: 08/12/2017 Document Revised: 01/20/2019 Document Reviewed: 08/12/2017 Elsevier Patient Education  2020 [employer].

## 2019-07-14 NOTE — Progress Notes (Signed)
Subjective:     Patient ID: Kristen Cannon , female    DOB: 10/03/1946 , 73 y.o.   MRN: [id number]   Chief Complaint  Patient presents with  . Annual Exam    HPI  Here for HM   Wt Readings from Last 3 Encounters: 07/14/19 : 174 lb (78.9 kg) 07/14/19 : 174 lb (78.9 kg) 02/15/19 : 175 lb 6.4 oz (79.6 kg)   Diabetes She presents for her follow-up diabetic visit. She has type 2 diabetes mellitus. Her disease course has been stable. There are no hypoglycemic associated symptoms. Pertinent negatives for hypoglycemia include no dizziness or headaches. There are no diabetic associated symptoms. Pertinent negatives for diabetes include no blurred vision and no fatigue. There are no hypoglycemic complications. There are no diabetic complications. Current diabetic treatment includes oral agent (dual therapy). She is compliant with treatment most of the time. She is following a generally healthy (decreased appetite) diet. There is no change in her home blood glucose trend. An ACE inhibitor/angiotensin II receptor blocker is being taken. Eye exam is current.  Hypertension This is a chronic problem. The current episode started more than 1 year ago. The problem is controlled. Pertinent negatives include no blurred vision or headaches.    The patient states she uses status post hysterectomy for birth control. Last LMP was No LMP recorded. Patient has had a hysterectomy.. Negative for Dysmenorrhea and Negative for Menorrhagia Mammogram last done 04/12/2019.  Negative for: breast discharge, breast lump(s), breast pain and breast self exam.  Pertinent negatives include abnormal bleeding (hematology), anxiety, decreased libido, depression, difficulty falling sleep, dyspareunia, history of infertility, nocturia, sexual dysfunction, sleep disturbances, urinary incontinence, urinary urgency, vaginal discharge and vaginal itching. Diet regular, limiting bread appetite is low. The patient states her  exercise level is  minimal.      The patient's tobacco use is:  Social History   Tobacco Use  Smoking Status Former Smoker  . Packs/day: 1.00  . Years: 12.00  . Pack years: 12.00  . Types: Cigars  . Quit date: 2007  . Years since quitting: 13.7  Smokeless Tobacco Never Used  Tobacco Comment   1 cigar a week   She has been exposed to passive smoke. The patient's alcohol use is:  Social History   Substance and Sexual Activity  Alcohol Use Never  . Frequency: Never    Past Medical History:  Diagnosis Date  . Diabetes mellitus   . High cholesterol   . Hypertension      Family History  Problem Relation Age of Onset  . Breast cancer Sister   . Breast cancer Daughter   . Early death Mother   . Early death Father      Current Outpatient Medications:  .  aspirin EC 81 MG tablet, Take 81 mg by mouth every morning., Disp: , Rfl:  .  atorvastatin (LIPITOR) 20 MG tablet, TAKE 1 TABLET BY MOUTH EVERY DAY, Disp: 90 tablet, Rfl: 0 .  cloNIDine (CATAPRES) 0.2 MG tablet, TAKE 1 TABLET BY MOUTH EVERY DAY, Disp: 90 tablet, Rfl: 0 .  glipiZIDE (GLUCOTROL) 10 MG tablet, TAKE 1 TABLET BY MOUTH EVERY MORNING BEFORE A MEAL AND BEFORE THE EVENING MEAL, Disp: 180 tablet, Rfl: 1 .  losartan-hydrochlorothiazide (HYZAAR) 100-12.5 MG tablet, Take 1 tablet by mouth daily., Disp: 90 tablet, Rfl: 1 .  Melatonin 3 MG CAPS, Take 1 capsule (3 mg total) by mouth at bedtime as needed. (Patient not taking: Reported on 07/14/2019), Disp: 30  capsule, Rfl: 1 .  TRADJENTA 5 MG TABS tablet, TAKE 1 TABLET BY MOUTH DAILY, Disp: 90 tablet, Rfl: 0 .  Vitamin D, Ergocalciferol, (DRISDOL) 1.25 MG (50000 UT) CAPS capsule, TAKE 1 CAPSULE BY MOUTH 2 TIMES A WEEK, Disp: 24 capsule, Rfl: 0   No Known Allergies   Review of Systems  Constitutional: Negative.  Negative for fatigue.  HENT: Negative.   Eyes: Negative.  Negative for blurred vision.  Respiratory: Negative.   Cardiovascular: Negative.   Gastrointestinal:  Negative.   Endocrine: Negative.   Genitourinary: Negative.   Musculoskeletal: Negative.   Skin: Negative.   Allergic/Immunologic: Negative.   Neurological: Negative.  Negative for dizziness and headaches.  Hematological: Negative.   Psychiatric/Behavioral: Negative.      Today's Vitals   07/14/19 0942  BP: 130/82  Pulse: 83  Temp: 98.6 F (37 C)  TempSrc: Oral  Weight: 174 lb (78.9 kg)  Height: 5' 0.8" (1.544 m)   Body mass index is 33.09 kg/m.   Objective:  Physical Exam Constitutional:      General: She is not in acute distress.    Appearance: Normal appearance. She is well-developed. She is obese.  HENT:     Head: Normocephalic and atraumatic.     Right Ear: Hearing, tympanic membrane, ear canal and external ear normal.     Left Ear: Hearing, tympanic membrane, ear canal and external ear normal.  Eyes:     General: Lids are normal.     Conjunctiva/sclera: Conjunctivae normal.     Pupils: Pupils are equal, round, and reactive to light.     Funduscopic exam:    Right eye: No papilledema.        Left eye: No papilledema.  Neck:     Musculoskeletal: Full passive range of motion without pain, normal range of motion and neck supple.     Thyroid: No thyroid mass.     Vascular: No carotid bruit.  Cardiovascular:     Rate and Rhythm: Normal rate and regular rhythm.     Pulses: Normal pulses.     Heart sounds: Normal heart sounds. No murmur.  Pulmonary:     Effort: Pulmonary effort is normal. No respiratory distress.     Breath sounds: Normal breath sounds.  Abdominal:     General: Abdomen is flat. Bowel sounds are normal.     Palpations: Abdomen is soft.  Musculoskeletal: Normal range of motion.        General: No swelling.     Right lower leg: No edema.     Left lower leg: No edema.  Skin:    General: Skin is warm and dry.     Capillary Refill: Capillary refill takes less than 2 seconds.  Neurological:     General: No focal deficit present.     Mental  Status: She is alert and oriented to person, place, and time.     Cranial Nerves: No cranial nerve deficit.     Sensory: No sensory deficit.  Psychiatric:        Mood and Affect: Mood normal.        Behavior: Behavior normal.        Thought Content: Thought content normal.        Judgment: Judgment normal.          Assessment And Plan:     1. Health maintenance examination . Behavior modifications discussed and diet history reviewed.   . Pt will continue to exercise regularly and modify diet  with low GI, plant based foods and decrease intake of processed foods.  . Recommend intake of daily multivitamin, Vitamin D, and calcium.  . Recommend for preventive screenings, as well as recommend immunizations that include influenza, TDAP  2. Diabetes mellitus with stage 3 chronic kidney disease (HCC)  Chronic, stable  Continue with current medications  Encouraged to limit intake of sugary foods and drinks  Encouraged to increase physical activity to 150 minutes per week - Hemoglobin A1c  3. Hypertensive nephropathy . B/P is stable  . CMP ordered to check renal function.  . The importance of regular exercise and dietary modification was stressed to the patient.  . Stressed importance of losing ten percent of her body weight to help with B/P control.  - CMP14 + Anion Gap  4. Class 1 obesity due to excess calories with serious comorbidity and body mass index (BMI) of 30.0 to 30.9 in adult  Chronic  Discussed healthy diet and regular exercise options   Encouraged to do chair exercises at least 150 minutes per week with 2 days of strength training with light 2lb weights or canned goods  5. Pure hypercholesterolemia  Chronic, controlled  Continue with current medications  No current issues with muscle weakness - Lipid panel   Minette Brine, FNP    THE PATIENT IS ENCOURAGED TO PRACTICE SOCIAL DISTANCING DUE TO THE COVID-19 PANDEMIC.

## 2019-07-14 NOTE — Progress Notes (Signed)
Subjective:   Kristen Cannon is a 73 y.o. female who presents for Medicare Annual (Subsequent) preventive examination.  Review of Systems:  n/a Cardiac Risk Factors include: advanced age (>33men, >31 women);diabetes mellitus;hypertension;sedentary lifestyle;obesity (BMI >30kg/m2)     Objective:     Vitals: BP 130/82 (BP Location: Left Arm, Patient Position: Sitting, Cuff Size: Normal)   Pulse 83   Temp 98.6 F (37 C) (Oral)   Ht 5' 0.8" (1.544 m)   Wt 174 lb (78.9 kg)   SpO2 92%   BMI 33.09 kg/m   Body mass index is 33.09 kg/m.  Advanced Directives 07/14/2019  Does Patient Have a Medical Advance Directive? No  Would patient like information on creating a medical advance directive? Yes (MAU/Ambulatory/Procedural Areas - Information given)    Tobacco Social History   Tobacco Use  Smoking Status Former Smoker  . Packs/day: 1.00  . Years: 12.00  . Pack years: 12.00  . Types: Cigars  . Quit date: 2007  . Years since quitting: 13.7  Smokeless Tobacco Never Used  Tobacco Comment   1 cigar a week     Counseling given: Not Answered Comment: 1 cigar a week   Clinical Intake:  Pre-visit preparation completed: Yes  Pain : No/denies pain     Nutritional Status: BMI > 30  Obese Nutritional Risks: None Diabetes: Yes CBG done?: No Did pt. bring in CBG monitor from home?: No  How often do you need to have someone help you when you read instructions, pamphlets, or other written materials from your doctor or pharmacy?: 1 - Never What is the last grade level you completed in school?: 9th grade  Interpreter Needed?: No  Information entered by :: NAllen LPN  Past Medical History:  Diagnosis Date  . Diabetes mellitus   . High cholesterol   . Hypertension    Past Surgical History:  Procedure Laterality Date  . CATARACT EXTRACTION Left 12/2018   DR GROAT  . PARTIAL HYSTERECTOMY  1974   Family History  Problem Relation Age of Onset  . Breast cancer  Sister   . Breast cancer Daughter   . Early death Mother   . Early death Father    Social History   Socioeconomic History  . Marital status: Divorced    Spouse name: Not on file  . Number of children: Not on file  . Years of education: Not on file  . Highest education level: Not on file  Occupational History  . Occupation: retired  Scientific laboratory technician  . Financial resource strain: Not hard at all  . Food insecurity    Worry: Never true    Inability: Never true  . Transportation needs    Medical: No    Non-medical: No  Tobacco Use  . Smoking status: Former Smoker    Packs/day: 1.00    Years: 12.00    Pack years: 12.00    Types: Cigars    Quit date: 2007    Years since quitting: 13.7  . Smokeless tobacco: Never Used  . Tobacco comment: 1 cigar a week  Substance and Sexual Activity  . Alcohol use: Never    Frequency: Never  . Drug use: No  . Sexual activity: Not Currently  Lifestyle  . Physical activity    Days per week: 0 days    Minutes per session: 0 min  . Stress: Not at all  Relationships  . Social connections    Talks on phone: Not on file  Gets together: Not on file    Attends religious service: Not on file    Active member of club or organization: Not on file    Attends meetings of clubs or organizations: Not on file    Relationship status: Not on file  Other Topics Concern  . Not on file  Social History Narrative  . Not on file    Outpatient Encounter Medications as of 07/14/2019  Medication Sig  . aspirin EC 81 MG tablet Take 81 mg by mouth every morning.  Marland Kitchen atorvastatin (LIPITOR) 20 MG tablet TAKE 1 TABLET BY MOUTH EVERY DAY  . cloNIDine (CATAPRES) 0.2 MG tablet TAKE 1 TABLET BY MOUTH EVERY DAY  . glipiZIDE (GLUCOTROL) 10 MG tablet TAKE 1 TABLET BY MOUTH EVERY MORNING BEFORE A MEAL AND BEFORE THE EVENING MEAL  . losartan-hydrochlorothiazide (HYZAAR) 100-12.5 MG tablet Take 1 tablet by mouth daily.  . TRADJENTA 5 MG TABS tablet TAKE 1 TABLET BY MOUTH  DAILY  . Vitamin D, Ergocalciferol, (DRISDOL) 1.25 MG (50000 UT) CAPS capsule TAKE 1 CAPSULE BY MOUTH 2 TIMES A WEEK  . Melatonin 3 MG CAPS Take 1 capsule (3 mg total) by mouth at bedtime as needed. (Patient not taking: Reported on 07/14/2019)   No facility-administered encounter medications on file as of 07/14/2019.     Activities of Daily Living In your present state of health, do you have any difficulty performing the following activities: 07/14/2019  Hearing? N  Vision? N  Difficulty concentrating or making decisions? N  Walking or climbing stairs? N  Dressing or bathing? N  Doing errands, shopping? N  Preparing Food and eating ? N  Using the Toilet? N  In the past six months, have you accidently leaked urine? N  Do you have problems with loss of bowel control? N  Managing your Medications? N  Managing your Finances? N  Housekeeping or managing your Housekeeping? N  Some recent data might be hidden    Patient Care Team: Minette Brine, FNP as PCP - General (General Practice)    Assessment:   This is a routine wellness examination for [name].  Exercise Activities and Dietary recommendations Current Exercise Habits: The patient does not participate in regular exercise at present  Goals    . Patient Stated     07/14/2019, no goals       Fall Risk Fall Risk  07/14/2019 02/15/2019 11/17/2018 11/10/2018  Falls in the past year? 0 0 0 0  Number falls in past yr: 0 - - -  Risk for fall due to : Medication side effect - - -  Follow up Falls evaluation completed;Education provided;Falls prevention discussed - - -   Is the patient's home free of loose throw rugs in walkways, pet beds, electrical cords, etc?   yes      Grab bars in the bathroom? yes      Handrails on the stairs?   yes      Adequate lighting?   yes  Timed Get Up and Go performed: n/a  Depression Screen PHQ 2/9 Scores 07/14/2019 02/15/2019 11/17/2018 11/10/2018  PHQ - 2 Score 0 3 0 1  PHQ- 9 Score 0 12 - 5      Cognitive Function     6CIT Screen 07/14/2019  What Year? 0 points  What month? 0 points  What time? 0 points  Count back from 20 0 points  Months in reverse 0 points  Repeat phrase 0 points  Total Score 0  Immunization History  Administered Date(s) Administered  . Influenza, High Dose Seasonal PF 07/14/2019  . Influenza-Unspecified 07/07/2018  . Tdap 01/08/2018    Qualifies for Shingles Vaccine? yes  Screening Tests Health Maintenance  Topic Date Due  . COLONOSCOPY  04/13/1994  . HEMOGLOBIN A1C  08/18/2019  . OPHTHALMOLOGY EXAM  12/02/2019  . FOOT EXAM  02/15/2020  . TETANUS/TDAP  01/09/2028  . INFLUENZA VACCINE  Completed  . DEXA SCAN  Completed  . Hepatitis C Screening  Completed  . PNA vac Low Risk Adult  Completed    Cancer Screenings: Lung: Low Dose CT Chest recommended if Age 71-80 years, 30 pack-year currently smoking OR have quit w/in 15years. Patient does not qualify. Breast:  Up to date on Mammogram? Yes   Up to date of Bone Density/Dexa? Yes Colorectal: up to date  Additional Screenings: : Hepatitis C Screening: 06/2017     Plan:    Patient has no goals set at this time.   I have personally reviewed and noted the following in the patient's chart:   . Medical and social history . Use of alcohol, tobacco or illicit drugs  . Current medications and supplements . Functional ability and status . Nutritional status . Physical activity . Advanced directives . List of other physicians . Hospitalizations, surgeries, and ER visits in previous 12 months . Vitals . Screenings to include cognitive, depression, and falls . Referrals and appointments  In addition, I have reviewed and discussed with patient certain preventive protocols, quality metrics, and best practice recommendations. A written personalized care plan for preventive services as well as general preventive health recommendations were provided to patient.     Kellie Simmering, LPN   [id number]

## 2019-07-14 NOTE — Patient Instructions (Signed)
[name] , Thank you for taking time to come for your Medicare Wellness Visit. I appreciate your ongoing commitment to your health goals. Please review the following plan we discussed and let me know if I can assist you in the future.   Screening recommendations/referrals: Colonoscopy: does FIT test Mammogram: 03/2019 Bone Density: 2008 Recommended yearly ophthalmology/optometry visit for glaucoma screening and checkup Recommended yearly dental visit for hygiene and checkup  Vaccinations: Influenza vaccine: today Pneumococcal vaccine: 09/2014 Tdap vaccine: 12/2017 Shingles vaccine: discussed    Advanced directives: Advance directive discussed with you today. I have provided a copy for you to complete at home and have notarized. Once this is complete please bring a copy in to our office so we can scan it into your chart.   Conditions/risks identified: obesity  Next appointment: 11/16/2019 at 11:30   Preventive Care 17 Years and Older, Female Preventive care refers to lifestyle choices and visits with your health care provider that can promote health and wellness. What does preventive care include?  A yearly physical exam. This is also called an annual well check.  Dental exams once or twice a year.  Routine eye exams. Ask your health care provider how often you should have your eyes checked.  Personal lifestyle choices, including:  Daily care of your teeth and gums.  Regular physical activity.  Eating a healthy diet.  Avoiding tobacco and drug use.  Limiting alcohol use.  Practicing safe sex.  Taking low-dose aspirin every day.  Taking vitamin and mineral supplements as recommended by your health care provider. What happens during an annual well check? The services and screenings done by your health care provider during your annual well check will depend on your age, overall health, lifestyle risk factors, and family history of disease. Counseling  Your health care  provider may ask you questions about your:  Alcohol use.  Tobacco use.  Drug use.  Emotional well-being.  Home and relationship well-being.  Sexual activity.  Eating habits.  History of falls.  Memory and ability to understand (cognition).  Work and work Statistician.  Reproductive health. Screening  You may have the following tests or measurements:  Height, weight, and BMI.  Blood pressure.  Lipid and cholesterol levels. These may be checked every 5 years, or more frequently if you are over 44 years old.  Skin check.  Lung cancer screening. You may have this screening every year starting at age 76 if you have a 30-pack-year history of smoking and currently smoke or have quit within the past 15 years.  Fecal occult blood test (FOBT) of the stool. You may have this test every year starting at age 75.  Flexible sigmoidoscopy or colonoscopy. You may have a sigmoidoscopy every 5 years or a colonoscopy every 10 years starting at age 53.  Hepatitis C blood test.  Hepatitis B blood test.  Sexually transmitted disease (STD) testing.  Diabetes screening. This is done by checking your blood sugar (glucose) after you have not eaten for a while (fasting). You may have this done every 1-3 years.  Bone density scan. This is done to screen for osteoporosis. You may have this done starting at age 70.  Mammogram. This may be done every 1-2 years. Talk to your health care provider about how often you should have regular mammograms. Talk with your health care provider about your test results, treatment options, and if necessary, the need for more tests. Vaccines  Your health care provider may recommend certain vaccines, such  as:  Influenza vaccine. This is recommended every year.  Tetanus, diphtheria, and acellular pertussis (Tdap, Td) vaccine. You may need a Td booster every 10 years.  Zoster vaccine. You may need this after age 34.  Pneumococcal 13-valent conjugate (PCV13)  vaccine. One dose is recommended after age 2.  Pneumococcal polysaccharide (PPSV23) vaccine. One dose is recommended after age 18. Talk to your health care provider about which screenings and vaccines you need and how often you need them. This information is not intended to replace advice given to you by your health care provider. Make sure you discuss any questions you have with your health care provider. Document Released: 10/26/2015 Document Revised: 06/18/2016 Document Reviewed: 07/31/2015 Elsevier Interactive Patient Education  2017 [place] Prevention in the Home Falls can cause injuries. They can happen to people of all ages. There are many things you can do to make your home safe and to help prevent falls. What can I do on the outside of my home?  Regularly fix the edges of walkways and driveways and fix any cracks.  Remove anything that might make you trip as you walk through a door, such as a raised step or threshold.  Trim any bushes or trees on the path to your home.  Use bright outdoor lighting.  Clear any walking paths of anything that might make someone trip, such as rocks or tools.  Regularly check to see if handrails are loose or broken. Make sure that both sides of any steps have handrails.  Any raised decks and porches should have guardrails on the edges.  Have any leaves, snow, or ice cleared regularly.  Use sand or salt on walking paths during winter.  Clean up any spills in your garage right away. This includes oil or grease spills. What can I do in the bathroom?  Use night lights.  Install grab bars by the toilet and in the tub and shower. Do not use towel bars as grab bars.  Use non-skid mats or decals in the tub or shower.  If you need to sit down in the shower, use a plastic, non-slip stool.  Keep the floor dry. Clean up any water that spills on the floor as soon as it happens.  Remove soap buildup in the tub or shower regularly.   Attach bath mats securely with double-sided non-slip rug tape.  Do not have throw rugs and other things on the floor that can make you trip. What can I do in the bedroom?  Use night lights.  Make sure that you have a light by your bed that is easy to reach.  Do not use any sheets or blankets that are too big for your bed. They should not hang down onto the floor.  Have a firm chair that has side arms. You can use this for support while you get dressed.  Do not have throw rugs and other things on the floor that can make you trip. What can I do in the kitchen?  Clean up any spills right away.  Avoid walking on wet floors.  Keep items that you use a lot in easy-to-reach places.  If you need to reach something above you, use a strong step stool that has a grab bar.  Keep electrical cords out of the way.  Do not use floor polish or wax that makes floors slippery. If you must use wax, use non-skid floor wax.  Do not have throw rugs and other things on the  floor that can make you trip. What can I do with my stairs?  Do not leave any items on the stairs.  Make sure that there are handrails on both sides of the stairs and use them. Fix handrails that are broken or loose. Make sure that handrails are as long as the stairways.  Check any carpeting to make sure that it is firmly attached to the stairs. Fix any carpet that is loose or worn.  Avoid having throw rugs at the top or bottom of the stairs. If you do have throw rugs, attach them to the floor with carpet tape.  Make sure that you have a light switch at the top of the stairs and the bottom of the stairs. If you do not have them, ask someone to add them for you. What else can I do to help prevent falls?  Wear shoes that:  Do not have high heels.  Have rubber bottoms.  Are comfortable and fit you well.  Are closed at the toe. Do not wear sandals.  If you use a stepladder:  Make sure that it is fully opened. Do not climb  a closed stepladder.  Make sure that both sides of the stepladder are locked into place.  Ask someone to hold it for you, if possible.  Clearly mark and make sure that you can see:  Any grab bars or handrails.  First and last steps.  Where the edge of each step is.  Use tools that help you move around (mobility aids) if they are needed. These include:  Canes.  Walkers.  Scooters.  Crutches.  Turn on the lights when you go into a dark area. Replace any light bulbs as soon as they burn out.  Set up your furniture so you have a clear path. Avoid moving your furniture around.  If any of your floors are uneven, fix them.  If there are any pets around you, be aware of where they are.  Review your medicines with your doctor. Some medicines can make you feel dizzy. This can increase your chance of falling. Ask your doctor what other things that you can do to help prevent falls. This information is not intended to replace advice given to you by your health care provider. Make sure you discuss any questions you have with your health care provider. Document Released: 07/26/2009 Document Revised: 03/06/2016 Document Reviewed: 11/03/2014 Elsevier Interactive Patient Education  2017 [employer].

## 2019-07-15 LAB — LIPID PANEL
Chol/HDL Ratio: 2.3 ratio (ref 0.0–4.4)
Cholesterol, Total: 136 mg/dL (ref 100–199)
HDL: 59 mg/dL (ref >39)
LDL Chol Calc (NIH): 61 mg/dL (ref 0–99)
Triglycerides: 80 mg/dL (ref 0–149)
VLDL Cholesterol Cal: 16 mg/dL (ref 5–40)

## 2019-07-15 LAB — CMP14 + ANION GAP
ALT: 22 IU/L (ref 0–32)
AST: 22 IU/L (ref 0–40)
Albumin/Globulin Ratio: 1.5 (ref 1.2–2.2)
Albumin: 4.5 g/dL (ref 3.7–4.7)
Alkaline Phosphatase: 76 IU/L (ref 39–117)
Anion Gap: 16 mmol/L (ref 10.0–18.0)
BUN/Creatinine Ratio: 19 (ref 12–28)
BUN: 24 mg/dL (ref 8–27)
Bilirubin Total: 0.3 mg/dL (ref 0.0–1.2)
CO2: 27 mmol/L (ref 20–29)
Calcium: 10.6 mg/dL — ABNORMAL HIGH (ref 8.7–10.3)
Chloride: 101 mmol/L (ref 96–106)
Creatinine, Ser: 1.28 mg/dL — ABNORMAL HIGH (ref 0.57–1.00)
GFR calc Af Amer: 47 mL/min/{1.73_m2} — ABNORMAL LOW (ref >59)
GFR calc non Af Amer: 41 mL/min/{1.73_m2} — ABNORMAL LOW (ref >59)
Globulin, Total: 3.1 g/dL (ref 1.5–4.5)
Glucose: 88 mg/dL (ref 65–99)
Potassium: 3.5 mmol/L (ref 3.5–5.2)
Sodium: 144 mmol/L (ref 134–144)
Total Protein: 7.6 g/dL (ref 6.0–8.5)

## 2019-07-15 LAB — HEMOGLOBIN A1C
Est. average glucose Bld gHb Est-mCnc: 163 mg/dL
Hgb A1c MFr Bld: 7.3 % — ABNORMAL HIGH (ref 4.8–5.6)

## 2019-07-18 ENCOUNTER — Other Ambulatory Visit: Payer: Self-pay

## 2019-07-18 MED ORDER — LOSARTAN POTASSIUM-HCTZ 100-12.5 MG PO TABS: 1.0000 | ORAL_TABLET | Freq: Every day | ORAL | 1 refills | Status: DC

## 2019-07-19 ENCOUNTER — Other Ambulatory Visit: Payer: Self-pay

## 2019-07-19 MED ORDER — ATORVASTATIN CALCIUM 20 MG PO TABS: 20.0000 mg | ORAL_TABLET | Freq: Every day | ORAL | 0 refills | Status: DC

## 2019-07-19 MED ORDER — CLONIDINE HCL 0.2 MG PO TABS: 0.2000 mg | ORAL_TABLET | Freq: Every day | ORAL | 0 refills | Status: DC

## 2019-10-12 ENCOUNTER — Other Ambulatory Visit: Payer: Self-pay | Admitting: Nurse Practitioner

## 2019-10-24 ENCOUNTER — Other Ambulatory Visit: Payer: Self-pay | Admitting: Nurse Practitioner

## 2019-11-16 ENCOUNTER — Ambulatory Visit: Payer: Medicare Other | Admitting: Nurse Practitioner

## 2019-11-18 ENCOUNTER — Other Ambulatory Visit: Payer: Self-pay | Admitting: Nurse Practitioner

## 2019-11-21 DIAGNOSIS — H1045 Other chronic allergic conjunctivitis: Secondary | ICD-10-CM | POA: Diagnosis not present

## 2019-11-21 DIAGNOSIS — H04123 Dry eye syndrome of bilateral lacrimal glands: Secondary | ICD-10-CM | POA: Diagnosis not present

## 2019-11-29 DIAGNOSIS — H40013 Open angle with borderline findings, low risk, bilateral: Secondary | ICD-10-CM | POA: Diagnosis not present

## 2019-11-29 DIAGNOSIS — H1045 Other chronic allergic conjunctivitis: Secondary | ICD-10-CM | POA: Diagnosis not present

## 2019-11-29 DIAGNOSIS — Z961 Presence of intraocular lens: Secondary | ICD-10-CM | POA: Diagnosis not present

## 2019-11-29 DIAGNOSIS — H04123 Dry eye syndrome of bilateral lacrimal glands: Secondary | ICD-10-CM | POA: Diagnosis not present

## 2019-11-29 DIAGNOSIS — H18513 Endothelial corneal dystrophy, bilateral: Secondary | ICD-10-CM | POA: Diagnosis not present

## 2019-11-29 LAB — HM DIABETES EYE EXAM

## 2019-12-01 ENCOUNTER — Ambulatory Visit: Payer: Medicare Other | Admitting: Nurse Practitioner

## 2019-12-07 ENCOUNTER — Ambulatory Visit (INDEPENDENT_AMBULATORY_CARE_PROVIDER_SITE_OTHER): Payer: Medicare Other

## 2019-12-07 ENCOUNTER — Other Ambulatory Visit: Payer: Self-pay

## 2019-12-07 VITALS — Ht <= 58 in | Wt 174.0 lb

## 2019-12-07 DIAGNOSIS — Z0000 Encounter for general adult medical examination without abnormal findings: Secondary | ICD-10-CM | POA: Diagnosis not present

## 2019-12-07 NOTE — Patient Instructions (Signed)
Kristen Cannon , Thank you for taking time to come for your Medicare Wellness Visit. I appreciate your ongoing commitment to your health goals. Please review the following plan we discussed and let me know if I can assist you in the future.   Screening recommendations/referrals: Colonoscopy: does yearly FIT test Mammogram: 03/2019 Bone Density: 06/2007 Recommended yearly ophthalmology/optometry visit for glaucoma screening and checkup Recommended yearly dental visit for hygiene and checkup  Vaccinations: Influenza vaccine: 07/2019 Pneumococcal vaccine: 09/2014 Tdap vaccine: 12/2017 Shingles vaccine: discussed    Advanced directives: Advance directive discussed with you today.  Conditions/risks identified: obesity  Next appointment: 07/18/2020 at 11:00   Preventive Care 76 Years and Older, Female Preventive care refers to lifestyle choices and visits with your health care provider that can promote health and wellness. What does preventive care include?  A yearly physical exam. This is also called an annual well check.  Dental exams once or twice a year.  Routine eye exams. Ask your health care provider how often you should have your eyes checked.  Personal lifestyle choices, including:  Daily care of your teeth and gums.  Regular physical activity.  Eating a healthy diet.  Avoiding tobacco and drug use.  Limiting alcohol use.  Practicing safe sex.  Taking low-dose aspirin every day.  Taking vitamin and mineral supplements as recommended by your health care provider. What happens during an annual well check? The services and screenings done by your health care provider during your annual well check will depend on your age, overall health, lifestyle risk factors, and family history of disease. Counseling  Your health care provider may ask you questions about your:  Alcohol use.  Tobacco use.  Drug use.  Emotional well-being.  Home and relationship  well-being.  Sexual activity.  Eating habits.  History of falls.  Memory and ability to understand (cognition).  Work and work Statistician.  Reproductive health. Screening  You may have the following tests or measurements:  Height, weight, and BMI.  Blood pressure.  Lipid and cholesterol levels. These may be checked every 5 years, or more frequently if you are over 42 years old.  Skin check.  Lung cancer screening. You may have this screening every year starting at age 9 if you have a 30-pack-year history of smoking and currently smoke or have quit within the past 15 years.  Fecal occult blood test (FOBT) of the stool. You may have this test every year starting at age 36.  Flexible sigmoidoscopy or colonoscopy. You may have a sigmoidoscopy every 5 years or a colonoscopy every 10 years starting at age 45.  Hepatitis C blood test.  Hepatitis B blood test.  Sexually transmitted disease (STD) testing.  Diabetes screening. This is done by checking your blood sugar (glucose) after you have not eaten for a while (fasting). You may have this done every 1-3 years.  Bone density scan. This is done to screen for osteoporosis. You may have this done starting at age 20.  Mammogram. This may be done every 1-2 years. Talk to your health care provider about how often you should have regular mammograms. Talk with your health care provider about your test results, treatment options, and if necessary, the need for more tests. Vaccines  Your health care provider may recommend certain vaccines, such as:  Influenza vaccine. This is recommended every year.  Tetanus, diphtheria, and acellular pertussis (Tdap, Td) vaccine. You may need a Td booster every 10 years.  Zoster vaccine. You may need this  after age 39.  Pneumococcal 13-valent conjugate (PCV13) vaccine. One dose is recommended after age 11.  Pneumococcal polysaccharide (PPSV23) vaccine. One dose is recommended after age  51. Talk to your health care provider about which screenings and vaccines you need and how often you need them. This information is not intended to replace advice given to you by your health care provider. Make sure you discuss any questions you have with your health care provider. Document Released: 10/26/2015 Document Revised: 06/18/2016 Document Reviewed: 07/31/2015 Elsevier Interactive Patient Education  2017 [place] Prevention in the Home Falls can cause injuries. They can happen to people of all ages. There are many things you can do to make your home safe and to help prevent falls. What can I do on the outside of my home?  Regularly fix the edges of walkways and driveways and fix any cracks.  Remove anything that might make you trip as you walk through a door, such as a raised step or threshold.  Trim any bushes or trees on the path to your home.  Use bright outdoor lighting.  Clear any walking paths of anything that might make someone trip, such as rocks or tools.  Regularly check to see if handrails are loose or broken. Make sure that both sides of any steps have handrails.  Any raised decks and porches should have guardrails on the edges.  Have any leaves, snow, or ice cleared regularly.  Use sand or salt on walking paths during winter.  Clean up any spills in your garage right away. This includes oil or grease spills. What can I do in the bathroom?  Use night lights.  Install grab bars by the toilet and in the tub and shower. Do not use towel bars as grab bars.  Use non-skid mats or decals in the tub or shower.  If you need to sit down in the shower, use a plastic, non-slip stool.  Keep the floor dry. Clean up any water that spills on the floor as soon as it happens.  Remove soap buildup in the tub or shower regularly.  Attach bath mats securely with double-sided non-slip rug tape.  Do not have throw rugs and other things on the floor that can make  you trip. What can I do in the bedroom?  Use night lights.  Make sure that you have a light by your bed that is easy to reach.  Do not use any sheets or blankets that are too big for your bed. They should not hang down onto the floor.  Have a firm chair that has side arms. You can use this for support while you get dressed.  Do not have throw rugs and other things on the floor that can make you trip. What can I do in the kitchen?  Clean up any spills right away.  Avoid walking on wet floors.  Keep items that you use a lot in easy-to-reach places.  If you need to reach something above you, use a strong step stool that has a grab bar.  Keep electrical cords out of the way.  Do not use floor polish or wax that makes floors slippery. If you must use wax, use non-skid floor wax.  Do not have throw rugs and other things on the floor that can make you trip. What can I do with my stairs?  Do not leave any items on the stairs.  Make sure that there are handrails on both sides of the  stairs and use them. Fix handrails that are broken or loose. Make sure that handrails are as long as the stairways.  Check any carpeting to make sure that it is firmly attached to the stairs. Fix any carpet that is loose or worn.  Avoid having throw rugs at the top or bottom of the stairs. If you do have throw rugs, attach them to the floor with carpet tape.  Make sure that you have a light switch at the top of the stairs and the bottom of the stairs. If you do not have them, ask someone to add them for you. What else can I do to help prevent falls?  Wear shoes that:  Do not have high heels.  Have rubber bottoms.  Are comfortable and fit you well.  Are closed at the toe. Do not wear sandals.  If you use a stepladder:  Make sure that it is fully opened. Do not climb a closed stepladder.  Make sure that both sides of the stepladder are locked into place.  Ask someone to hold it for you, if  possible.  Clearly mark and make sure that you can see:  Any grab bars or handrails.  First and last steps.  Where the edge of each step is.  Use tools that help you move around (mobility aids) if they are needed. These include:  Canes.  Walkers.  Scooters.  Crutches.  Turn on the lights when you go into a dark area. Replace any light bulbs as soon as they burn out.  Set up your furniture so you have a clear path. Avoid moving your furniture around.  If any of your floors are uneven, fix them.  If there are any pets around you, be aware of where they are.  Review your medicines with your doctor. Some medicines can make you feel dizzy. This can increase your chance of falling. Ask your doctor what other things that you can do to help prevent falls. This information is not intended to replace advice given to you by your health care provider. Make sure you discuss any questions you have with your health care provider. Document Released: 07/26/2009 Document Revised: 03/06/2016 Document Reviewed: 11/03/2014 Elsevier Interactive Patient Education  2017 [employer].

## 2019-12-07 NOTE — Progress Notes (Signed)
This visit type was conducted due to national recommendations for restrictions regarding the COVID-19 Pandemic (e.g. social distancing). This format is felt to be most appropriate for this patient at this time. All issues noted in this document were discussed and addressed. No physical exam was performed (except for noted visual exam findings with Video Visits). This patient, Kristen Cannon, has given permission to perform this visit via telephone. Vital signs may be absent or patient reported.  Patient location:  At home  Nurse location:  [facility] office    Subjective:   Kristen Cannon is a 74 y.o. female who presents for Medicare Annual (Subsequent) preventive examination.  Review of Systems:  n/a Cardiac Risk Factors include: advanced age (>40men, >27 women);diabetes mellitus;hypertension;sedentary lifestyle;obesity (BMI >30kg/m2)     Objective:     Vitals: Ht 4\' 9"  (1.448 m) Comment: per patient  Wt 174 lb (78.9 kg) Comment: per patient  BMI 37.65 kg/m   Body mass index is 37.65 kg/m.  Advanced Directives 12/07/2019 07/14/2019  Does Patient Have a Medical Advance Directive? No No  Would patient like information on creating a medical advance directive? - Yes (MAU/Ambulatory/Procedural Areas - Information given)    Tobacco Social History   Tobacco Use  Smoking Status Former Smoker  . Packs/day: 1.00  . Years: 12.00  . Pack years: 12.00  . Types: Cigars  . Quit date: 2007  . Years since quitting: 14.1  Smokeless Tobacco Never Used  Tobacco Comment   1 cigar a week     Counseling given: Not Answered Comment: 1 cigar a week   Clinical Intake:  Pre-visit preparation completed: Yes  Pain : No/denies pain     Nutritional Status: BMI > 30  Obese Nutritional Risks: None Diabetes: Yes  How often do you need to have someone help you when you read instructions, pamphlets, or other written materials from your doctor or pharmacy?: 1 - Never What is the  last grade level you completed in school?: 9th grade  Interpreter Needed?: No  Information entered by :: NAllen LPN  Past Medical History:  Diagnosis Date  . Diabetes mellitus   . High cholesterol   . Hypertension    Past Surgical History:  Procedure Laterality Date  . CATARACT EXTRACTION Left 12/2018   DR GROAT  . PARTIAL HYSTERECTOMY  1974   Family History  Problem Relation Age of Onset  . Breast cancer Sister   . Breast cancer Daughter   . Early death Mother   . Early death Father    Social History   Socioeconomic History  . Marital status: Divorced    Spouse name: Not on file  . Number of children: Not on file  . Years of education: Not on file  . Highest education level: Not on file  Occupational History  . Occupation: retired  Tobacco Use  . Smoking status: Former Smoker    Packs/day: 1.00    Years: 12.00    Pack years: 12.00    Types: Cigars    Quit date: 2007    Years since quitting: 14.1  . Smokeless tobacco: Never Used  . Tobacco comment: 1 cigar a week  Substance and Sexual Activity  . Alcohol use: Never  . Drug use: No  . Sexual activity: Not Currently  Other Topics Concern  . Not on file  Social History Narrative  . Not on file   Social Determinants of Health   Financial Resource Strain: Low Risk   . Difficulty  of Paying Living Expenses: Not hard at all  Food Insecurity: No Food Insecurity  . Worried About Charity fundraiser in the Last Year: Never true  . Ran Out of Food in the Last Year: Never true  Transportation Needs: No Transportation Needs  . Lack of Transportation (Medical): No  . Lack of Transportation (Non-Medical): No  Physical Activity: Inactive  . Days of Exercise per Week: 0 days  . Minutes of Exercise per Session: 0 min  Stress: No Stress Concern Present  . Feeling of Stress : Not at all  Social Connections:   . Frequency of Communication with Friends and Family: Not on file  . Frequency of Social Gatherings with  Friends and Family: Not on file  . Attends Religious Services: Not on file  . Active Member of Clubs or Organizations: Not on file  . Attends Archivist Meetings: Not on file  . Marital Status: Not on file    Outpatient Encounter Medications as of 12/07/2019  Medication Sig  . aspirin EC 81 MG tablet Take 81 mg by mouth every morning.  Marland Kitchen atorvastatin (LIPITOR) 20 MG tablet TAKE 1 TABLET(20 MG) BY MOUTH DAILY  . cloNIDine (CATAPRES) 0.2 MG tablet TAKE 1 TABLET(0.2 MG) BY MOUTH DAILY  . glipiZIDE (GLUCOTROL) 10 MG tablet TAKE 1 TABLET BY MOUTH EVERY MORNING BEFORE A MEAL AND BEFORE THE EVENING MEAL  . losartan-hydrochlorothiazide (HYZAAR) 100-12.5 MG tablet Take 1 tablet by mouth daily.  . TRADJENTA 5 MG TABS tablet TAKE 1 TABLET BY MOUTH DAILY  . Vitamin D, Ergocalciferol, (DRISDOL) 1.25 MG (50000 UT) CAPS capsule TAKE 1 CAPSULE BY MOUTH 2 TIMES A WEEK  . Melatonin 3 MG CAPS Take 1 capsule (3 mg total) by mouth at bedtime as needed. (Patient not taking: Reported on 07/14/2019)   No facility-administered encounter medications on file as of 12/07/2019.    Activities of Daily Living In your present state of health, do you have any difficulty performing the following activities: 12/07/2019 07/14/2019  Hearing? N N  Vision? N N  Difficulty concentrating or making decisions? Y N  Walking or climbing stairs? N N  Dressing or bathing? N N  Doing errands, shopping? N N  Preparing Food and eating ? N N  Using the Toilet? N N  In the past six months, have you accidently leaked urine? N N  Do you have problems with loss of bowel control? N N  Managing your Medications? N N  Managing your Finances? N N  Housekeeping or managing your Housekeeping? N N  Some recent data might be hidden    Patient Care Team: Minette Brine, FNP as PCP - General (General Practice)    Assessment:   This is a routine wellness examination for [facility].  Exercise Activities and Dietary  recommendations Current Exercise Habits: The patient does not participate in regular exercise at present  Goals    . Patient Stated     07/14/2019, no goals    . Patient Stated     12/07/2019 no goals       Fall Risk Fall Risk  12/07/2019 07/14/2019 02/15/2019 11/17/2018 11/10/2018  Falls in the past year? 0 0 0 0 0  Number falls in past yr: - 0 - - -  Risk for fall due to : Medication side effect Medication side effect - - -  Follow up Education provided;Falls prevention discussed;Falls evaluation completed Falls evaluation completed;Education provided;Falls prevention discussed - - -   Is the patient's  home free of loose throw rugs in walkways, pet beds, electrical cords, etc?   yes      Grab bars in the bathroom? yes      Handrails on the stairs?   n/a      Adequate lighting?   yes  Timed Get Up and Go performed: n/a  Depression Screen PHQ 2/9 Scores 12/07/2019 07/14/2019 02/15/2019 11/17/2018  PHQ - 2 Score 0 0 3 0  PHQ- 9 Score 4 0 12 -     Cognitive Function     6CIT Screen 12/07/2019 07/14/2019  What Year? 0 points 0 points  What month? 0 points 0 points  What time? 0 points 0 points  Count back from 20 0 points 0 points  Months in reverse 0 points 0 points  Repeat phrase 0 points 0 points  Total Score 0 0    Immunization History  Administered Date(s) Administered  . Influenza, High Dose Seasonal PF 07/14/2019  . Influenza-Unspecified 07/07/2018  . Tdap 01/08/2018    Qualifies for Shingles Vaccine? yes  Screening Tests Health Maintenance  Topic Date Due  . COLONOSCOPY  04/13/1994  . OPHTHALMOLOGY EXAM  12/02/2019  . HEMOGLOBIN A1C  01/12/2020  . FOOT EXAM  02/15/2020  . TETANUS/TDAP  01/09/2028  . INFLUENZA VACCINE  Completed  . DEXA SCAN  Completed  . Hepatitis C Screening  Completed  . PNA vac Low Risk Adult  Completed    Cancer Screenings: Lung: Low Dose CT Chest recommended if Age 45-80 years, 30 pack-year currently smoking OR have quit w/in 15years.  Patient does not qualify. Breast:  Up to date on Mammogram? Yes   Up to date of Bone Density/Dexa? yes Colorectal: up to date  Additional Screenings: : Hepatitis C Screening: 06/24/2017     Plan:    Patient has no goals set at this time. States has lost a lot of weight. She has cut most carbs out of her diet and has not found anything she likes to eat in its place.   I have personally reviewed and noted the following in the patient's chart:   . Medical and social history . Use of alcohol, tobacco or illicit drugs  . Current medications and supplements . Functional ability and status . Nutritional status . Physical activity . Advanced directives . List of other physicians . Hospitalizations, surgeries, and ER visits in previous 12 months . Vitals . Screenings to include cognitive, depression, and falls . Referrals and appointments  In addition, I have reviewed and discussed with patient certain preventive protocols, quality metrics, and best practice recommendations. A written personalized care plan for preventive services as well as general preventive health recommendations were provided to patient.     Kellie Simmering, LPN  [id number]

## 2019-12-12 ENCOUNTER — Other Ambulatory Visit: Payer: Self-pay | Admitting: Internal Medicine

## 2019-12-15 DIAGNOSIS — H40013 Open angle with borderline findings, low risk, bilateral: Secondary | ICD-10-CM | POA: Diagnosis not present

## 2019-12-21 ENCOUNTER — Ambulatory Visit: Payer: Medicare Other | Admitting: Nurse Practitioner

## 2019-12-29 DIAGNOSIS — H40013 Open angle with borderline findings, low risk, bilateral: Secondary | ICD-10-CM | POA: Diagnosis not present

## 2020-01-03 ENCOUNTER — Encounter: Payer: Self-pay | Admitting: Nurse Practitioner

## 2020-01-03 ENCOUNTER — Ambulatory Visit (INDEPENDENT_AMBULATORY_CARE_PROVIDER_SITE_OTHER): Payer: Medicare Other | Admitting: Nurse Practitioner

## 2020-01-03 ENCOUNTER — Other Ambulatory Visit: Payer: Self-pay

## 2020-01-03 VITALS — BP 124/90 | HR 86 | Temp 98.4°F | Ht 60.8 in | Wt 175.2 lb

## 2020-01-03 DIAGNOSIS — E1122 Type 2 diabetes mellitus with diabetic chronic kidney disease: Secondary | ICD-10-CM

## 2020-01-03 DIAGNOSIS — N183 Chronic kidney disease, stage 3 unspecified: Secondary | ICD-10-CM | POA: Diagnosis not present

## 2020-01-03 DIAGNOSIS — E7800 Pure hypercholesterolemia, unspecified: Secondary | ICD-10-CM

## 2020-01-03 DIAGNOSIS — I129 Hypertensive chronic kidney disease with stage 1 through stage 4 chronic kidney disease, or unspecified chronic kidney disease: Secondary | ICD-10-CM

## 2020-01-03 DIAGNOSIS — Z79899 Other long term (current) drug therapy: Secondary | ICD-10-CM | POA: Diagnosis not present

## 2020-01-03 DIAGNOSIS — E2839 Other primary ovarian failure: Secondary | ICD-10-CM

## 2020-01-03 NOTE — Progress Notes (Signed)
This visit occurred during the SARS-CoV-2 public health emergency.  Safety protocols were in place, including screening questions prior to the visit, additional usage of staff PPE, and extensive cleaning of exam room while observing appropriate contact time as indicated for disinfecting solutions.  Subjective:     Patient ID: Kristen Cannon , female    DOB: Jun 29, 1946 , 74 y.o.   MRN: [id number]   Chief Complaint  Patient presents with  . Diabetes    HPI  Wt Readings from Last 3 Encounters: 01/03/20 : 175 lb 3.2 oz (79.5 kg) 12/07/19 : 174 lb (78.9 kg) 07/14/19 : 174 lb (78.9 kg)  She is concerned about losing weight in her buttocks and thighs  Diabetes She presents for her follow-up diabetic visit. She has type 2 diabetes mellitus. There are no hypoglycemic associated symptoms. Pertinent negatives for hypoglycemia include no dizziness or headaches. There are no diabetic associated symptoms. Pertinent negatives for diabetes include no blurred vision, no chest pain and no fatigue. There are no hypoglycemic complications. There are no diabetic complications. Risk factors for coronary artery disease include obesity and sedentary lifestyle. Current diabetic treatment includes oral agent (dual therapy). She is compliant with treatment all of the time. She does not see a podiatrist.Eye exam is not current.     Past Medical History:  Diagnosis Date  . Diabetes mellitus   . High cholesterol   . Hypertension      Family History  Problem Relation Age of Onset  . Breast cancer Sister   . Breast cancer Daughter   . Early death Mother   . Early death Father      Current Outpatient Medications:  .  aspirin EC 81 MG tablet, Take 81 mg by mouth every morning., Disp: , Rfl:  .  atorvastatin (LIPITOR) 20 MG tablet, TAKE 1 TABLET(20 MG) BY MOUTH DAILY, Disp: 90 tablet, Rfl: 0 .  cloNIDine (CATAPRES) 0.2 MG tablet, TAKE 1 TABLET(0.2 MG) BY MOUTH DAILY, Disp: 90 tablet, Rfl: 0 .   glipiZIDE (GLUCOTROL) 10 MG tablet, TAKE 1 TABLET BY MOUTH EVERY MORNING BEFORE A MEAL AND BEFORE THE EVENING MEAL, Disp: 180 tablet, Rfl: 1 .  losartan-hydrochlorothiazide (HYZAAR) 100-12.5 MG tablet, Take 1 tablet by mouth daily., Disp: 90 tablet, Rfl: 1 .  TRADJENTA 5 MG TABS tablet, TAKE 1 TABLET BY MOUTH DAILY, Disp: 90 tablet, Rfl: 0 .  Vitamin D, Ergocalciferol, (DRISDOL) 1.25 MG (50000 UNIT) CAPS capsule, TAKE 1 CAPSULE BY MOUTH ONCE A WEEK, Disp: 12 capsule, Rfl: 1   No Known Allergies   Review of Systems  Constitutional: Negative.  Negative for fatigue.  Eyes: Negative for blurred vision.  Respiratory: Negative.   Cardiovascular: Negative.  Negative for chest pain, palpitations and leg swelling.  Neurological: Negative for dizziness and headaches.  Psychiatric/Behavioral: Negative.      Today's Vitals   01/03/20 1503  BP: 124/90  Pulse: 86  Temp: 98.4 F (36.9 C)  TempSrc: Oral  Weight: 175 lb 3.2 oz (79.5 kg)  Height: 5' 0.8" (1.544 m)  PainSc: 0-No pain   Body mass index is 33.32 kg/m.   Objective:  Physical Exam Constitutional:      Appearance: Normal appearance.  Cardiovascular:     Rate and Rhythm: Normal rate and regular rhythm.     Pulses: Normal pulses.     Heart sounds: Normal heart sounds. No murmur.  Pulmonary:     Effort: Pulmonary effort is normal. No respiratory distress.     Breath  sounds: Normal breath sounds.  Neurological:     General: No focal deficit present.     Mental Status: She is alert and oriented to person, place, and time.  Psychiatric:        Mood and Affect: Mood normal.        Behavior: Behavior normal.        Thought Content: Thought content normal.        Judgment: Judgment normal.         Assessment And Plan:     1. Diabetes mellitus with stage 3 chronic kidney disease (HCC) Chronic, controlled Continue with current medications Encouraged to limit intake of sugary foods and drinks Encouraged to increase physical  activity to 150 minutes per week as tolerated - CMP14+EGFR - Lipid panel - Hemoglobin A1c  2. Hypertensive nephropathy . B/P is controlled.  . CMP ordered to check renal function.  . The importance of regular exercise and dietary modification was stressed to the patient.  . Stressed importance of losing ten percent of her body weight to help with B/P control. - CMP14+EGFR  3. Pure hypercholesterolemia Chronic, controlled Continue with current medications  4. Other long term (current) drug therapy  - TSH  5. Decreased estrogen level  - DG Bone Density; Future   Minette Brine, FNP    THE PATIENT IS ENCOURAGED TO PRACTICE SOCIAL DISTANCING DUE TO THE COVID-19 PANDEMIC.

## 2020-01-04 LAB — CMP14+EGFR
ALT: 26 IU/L (ref 0–32)
AST: 26 IU/L (ref 0–40)
Albumin/Globulin Ratio: 1.4 (ref 1.2–2.2)
Albumin: 4.4 g/dL (ref 3.7–4.7)
Alkaline Phosphatase: 78 IU/L (ref 39–117)
BUN/Creatinine Ratio: 16 (ref 12–28)
BUN: 23 mg/dL (ref 8–27)
Bilirubin Total: 0.3 mg/dL (ref 0.0–1.2)
CO2: 23 mmol/L (ref 20–29)
Calcium: 10.3 mg/dL (ref 8.7–10.3)
Chloride: 104 mmol/L (ref 96–106)
Creatinine, Ser: 1.47 mg/dL — ABNORMAL HIGH (ref 0.57–1.00)
GFR calc Af Amer: 40 mL/min/{1.73_m2} — ABNORMAL LOW (ref >59)
GFR calc non Af Amer: 35 mL/min/{1.73_m2} — ABNORMAL LOW (ref >59)
Globulin, Total: 3.1 g/dL (ref 1.5–4.5)
Glucose: 138 mg/dL — ABNORMAL HIGH (ref 65–99)
Potassium: 3.9 mmol/L (ref 3.5–5.2)
Sodium: 144 mmol/L (ref 134–144)
Total Protein: 7.5 g/dL (ref 6.0–8.5)

## 2020-01-04 LAB — LIPID PANEL
Chol/HDL Ratio: 2.4 ratio (ref 0.0–4.4)
Cholesterol, Total: 135 mg/dL (ref 100–199)
HDL: 57 mg/dL (ref >39)
LDL Chol Calc (NIH): 62 mg/dL (ref 0–99)
Triglycerides: 82 mg/dL (ref 0–149)
VLDL Cholesterol Cal: 16 mg/dL (ref 5–40)

## 2020-01-04 LAB — HEMOGLOBIN A1C
Est. average glucose Bld gHb Est-mCnc: 166 mg/dL
Hgb A1c MFr Bld: 7.4 % — ABNORMAL HIGH (ref 4.8–5.6)

## 2020-01-04 LAB — TSH: TSH: 3.13 u[IU]/mL (ref 0.450–4.500)

## 2020-01-17 ENCOUNTER — Other Ambulatory Visit: Payer: Self-pay | Admitting: Nurse Practitioner

## 2020-01-19 ENCOUNTER — Encounter: Payer: Self-pay | Admitting: Nurse Practitioner

## 2020-01-30 ENCOUNTER — Other Ambulatory Visit: Payer: Self-pay | Admitting: Nurse Practitioner

## 2020-02-09 ENCOUNTER — Encounter: Payer: Self-pay | Admitting: Nurse Practitioner

## 2020-02-09 DIAGNOSIS — H40013 Open angle with borderline findings, low risk, bilateral: Secondary | ICD-10-CM | POA: Diagnosis not present

## 2020-02-09 DIAGNOSIS — Z9889 Other specified postprocedural states: Secondary | ICD-10-CM | POA: Diagnosis not present

## 2020-02-15 ENCOUNTER — Other Ambulatory Visit: Payer: Self-pay | Admitting: Nurse Practitioner

## 2020-03-06 ENCOUNTER — Other Ambulatory Visit: Payer: Self-pay | Admitting: Nurse Practitioner

## 2020-03-06 DIAGNOSIS — Z1231 Encounter for screening mammogram for malignant neoplasm of breast: Secondary | ICD-10-CM

## 2020-03-20 ENCOUNTER — Other Ambulatory Visit: Payer: Self-pay

## 2020-03-20 MED ORDER — LOSARTAN POTASSIUM-HCTZ 100-12.5 MG PO TABS: 1.0000 | ORAL_TABLET | Freq: Every day | ORAL | 1 refills | Status: DC

## 2020-03-20 MED ORDER — ATORVASTATIN CALCIUM 20 MG PO TABS: ORAL_TABLET | ORAL | 0 refills | Status: DC

## 2020-03-26 ENCOUNTER — Other Ambulatory Visit: Payer: Self-pay

## 2020-03-26 MED ORDER — LOSARTAN POTASSIUM-HCTZ 100-12.5 MG PO TABS: 1.0000 | ORAL_TABLET | Freq: Every day | ORAL | 1 refills | Status: DC

## 2020-03-26 MED ORDER — VITAMIN D (ERGOCALCIFEROL) 1.25 MG (50000 UNIT) PO CAPS: 50000.0000 [IU] | ORAL_CAPSULE | ORAL | 1 refills | Status: DC

## 2020-04-04 ENCOUNTER — Other Ambulatory Visit: Payer: Self-pay

## 2020-04-04 ENCOUNTER — Ambulatory Visit (INDEPENDENT_AMBULATORY_CARE_PROVIDER_SITE_OTHER): Payer: Medicare Other | Admitting: Nurse Practitioner

## 2020-04-04 ENCOUNTER — Encounter: Payer: Self-pay | Admitting: Nurse Practitioner

## 2020-04-04 VITALS — BP 126/72 | HR 75 | Temp 98.1°F | Ht 58.6 in | Wt 172.6 lb

## 2020-04-04 DIAGNOSIS — N183 Chronic kidney disease, stage 3 unspecified: Secondary | ICD-10-CM

## 2020-04-04 DIAGNOSIS — R202 Paresthesia of skin: Secondary | ICD-10-CM | POA: Diagnosis not present

## 2020-04-04 DIAGNOSIS — E1122 Type 2 diabetes mellitus with diabetic chronic kidney disease: Secondary | ICD-10-CM | POA: Diagnosis not present

## 2020-04-04 DIAGNOSIS — M25512 Pain in left shoulder: Secondary | ICD-10-CM | POA: Diagnosis not present

## 2020-04-04 DIAGNOSIS — E559 Vitamin D deficiency, unspecified: Secondary | ICD-10-CM

## 2020-04-04 DIAGNOSIS — K5900 Constipation, unspecified: Secondary | ICD-10-CM | POA: Diagnosis not present

## 2020-04-04 DIAGNOSIS — M25511 Pain in right shoulder: Secondary | ICD-10-CM | POA: Diagnosis not present

## 2020-04-04 DIAGNOSIS — I129 Hypertensive chronic kidney disease with stage 1 through stage 4 chronic kidney disease, or unspecified chronic kidney disease: Secondary | ICD-10-CM

## 2020-04-04 MED ORDER — DICLOFENAC SODIUM 1 % EX GEL: 2.0000 g | Freq: Four times a day (QID) | CUTANEOUS | 3 refills | Status: DC

## 2020-04-04 MED ORDER — LINACLOTIDE 72 MCG PO CAPS: 72.0000 ug | ORAL_CAPSULE | Freq: Every day | ORAL | 1 refills | Status: DC

## 2020-04-04 NOTE — Progress Notes (Signed)
This visit occurred during the SARS-CoV-2 public health emergency.  Safety protocols were in place, including screening questions prior to the visit, additional usage of staff PPE, and extensive cleaning of exam room while observing appropriate contact time as indicated for disinfecting solutions.  Subjective:     Patient ID: Kristen Cannon , female    DOB: October 02, 1946 , 74 y.o.   MRN: [id number]   Chief Complaint  Patient presents with  . Diabetes  . Arm Pain    patient stated she has been having pain in both her arms     HPI  Wt Readings from Last 3 Encounters: 04/04/20 : 172 lb 9.6 oz (78.3 kg) 01/03/20 : 175 lb 3.2 oz (79.5 kg) 12/07/19 : 174 lb (78.9 kg)   Diabetes She presents for her follow-up diabetic visit. She has type 2 diabetes mellitus. There are no hypoglycemic associated symptoms. Pertinent negatives for hypoglycemia include no dizziness or headaches. There are no diabetic associated symptoms. Pertinent negatives for diabetes include no blurred vision, no chest pain and no fatigue. There are no hypoglycemic complications. There are no diabetic complications. Risk factors for coronary artery disease include obesity and sedentary lifestyle. Current diabetic treatment includes oral agent (dual therapy). She is compliant with treatment all of the time. She does not see a podiatrist.Eye exam is not current.  Arm Pain  The incident occurred more than 1 week ago. The pain is present in the right shoulder and left shoulder. The quality of the pain is described as aching. The pain does not radiate. Pertinent negatives include no chest pain. She has tried nothing for the symptoms.     Past Medical History:  Diagnosis Date  . Diabetes mellitus   . High cholesterol   . Hypertension      Family History  Problem Relation Age of Onset  . Breast cancer Sister   . Breast cancer Daughter   . Early death Mother   . Early death Father      Current Outpatient  Medications:  .  aspirin EC 81 MG tablet, Take 81 mg by mouth every morning., Disp: , Rfl:  .  atorvastatin (LIPITOR) 20 MG tablet, TAKE 1 TABLET(20 MG) BY MOUTH DAILY, Disp: 90 tablet, Rfl: 0 .  cloNIDine (CATAPRES) 0.2 MG tablet, TAKE 1 TABLET(0.2 MG) BY MOUTH DAILY, Disp: 90 tablet, Rfl: 0 .  glipiZIDE (GLUCOTROL) 10 MG tablet, TAKE 1 TABLET BY MOUTH EVERY MORNING BEFORE A MEAL AND BEFORE THE EVENING MEAL, Disp: 180 tablet, Rfl: 1 .  linaCLOtide (LINZESS PO), Take by mouth. As needed, Disp: , Rfl:  .  losartan-hydrochlorothiazide (HYZAAR) 100-12.5 MG tablet, Take 1 tablet by mouth daily., Disp: 90 tablet, Rfl: 1 .  TRADJENTA 5 MG TABS tablet, TAKE 1 TABLET BY MOUTH DAILY, Disp: 90 tablet, Rfl: 0 .  Vitamin D, Ergocalciferol, (DRISDOL) 1.25 MG (50000 UNIT) CAPS capsule, Take 1 capsule (50,000 Units total) by mouth once a week., Disp: 12 capsule, Rfl: 1   No Known Allergies   Review of Systems  Constitutional: Negative for fatigue.  Eyes: Negative for blurred vision.  Respiratory: Negative.  Negative for cough.   Cardiovascular: Negative for chest pain, palpitations and leg swelling.  Neurological: Negative for dizziness and headaches.  Psychiatric/Behavioral: Negative.      Today's Vitals   04/04/20 1410  BP: 126/72  Pulse: 75  Temp: 98.1 F (36.7 C)  TempSrc: Oral  Weight: 172 lb 9.6 oz (78.3 kg)  Height: 4' 10.6" (1.488 m)  PainSc: 0-No pain   Body mass index is 35.34 kg/m.   Objective:  Physical Exam Constitutional:      General: She is not in acute distress.    Appearance: Normal appearance. She is obese.  Cardiovascular:     Rate and Rhythm: Normal rate and regular rhythm.     Pulses: Normal pulses.     Heart sounds: Normal heart sounds. No murmur heard.   Pulmonary:     Effort: Pulmonary effort is normal. No respiratory distress.     Breath sounds: Normal breath sounds.  Neurological:     General: No focal deficit present.     Mental Status: She is alert and  oriented to person, place, and time.  Psychiatric:        Mood and Affect: Mood normal.        Behavior: Behavior normal.        Thought Content: Thought content normal.        Judgment: Judgment normal.         Assessment And Plan:     1. Diabetes mellitus with stage 3 chronic kidney disease (HCC)  Chronic, fair control  Discussed may need to add another medication - CMP14+EGFR - Hemoglobin A1c  2. Acute pain of right shoulder  No abnormal findings to shoulder other than mild soreness   Will treat with diclofenac and she can take tylenol as needed   3. Acute pain of left shoulder  No abnormal findings to shoulder other than mild soreness   Will treat with diclofenac and she can take tylenol as needed   4. Constipation, unspecified constipation type  She would like another refill.   - linaclotide (LINZESS) 72 MCG capsule; Take 1 capsule (72 mcg total) by mouth daily before breakfast. As needed  Dispense: 90 capsule; Refill: 1  5. Vitamin D deficiency  Will check vitamin D level and supplement as needed.     Also encouraged to spend 15 minutes in the sun daily.  - VITAMIN D 25 Hydroxy (Vit-D Deficiency, Fractures)  6. Hypertensive nephropathy  Chronic, well controlled  Continue with current medications  7. Tingling of skin  Intermittent tingling to right leg  Will check vitamin B12 - Vitamin B12   Minette Brine, FNP    THE PATIENT IS ENCOURAGED TO PRACTICE SOCIAL DISTANCING DUE TO THE COVID-19 PANDEMIC.

## 2020-04-05 LAB — HEMOGLOBIN A1C
Est. average glucose Bld gHb Est-mCnc: 163 mg/dL
Hgb A1c MFr Bld: 7.3 % — ABNORMAL HIGH (ref 4.8–5.6)

## 2020-04-05 LAB — CMP14+EGFR
ALT: 27 IU/L (ref 0–32)
AST: 25 IU/L (ref 0–40)
Albumin/Globulin Ratio: 1.4 (ref 1.2–2.2)
Albumin: 4.3 g/dL (ref 3.7–4.7)
Alkaline Phosphatase: 69 IU/L (ref 48–121)
BUN/Creatinine Ratio: 19 (ref 12–28)
BUN: 26 mg/dL (ref 8–27)
Bilirubin Total: 0.2 mg/dL (ref 0.0–1.2)
CO2: 26 mmol/L (ref 20–29)
Calcium: 10.9 mg/dL — ABNORMAL HIGH (ref 8.7–10.3)
Chloride: 105 mmol/L (ref 96–106)
Creatinine, Ser: 1.39 mg/dL — ABNORMAL HIGH (ref 0.57–1.00)
GFR calc Af Amer: 43 mL/min/{1.73_m2} — ABNORMAL LOW (ref >59)
GFR calc non Af Amer: 37 mL/min/{1.73_m2} — ABNORMAL LOW (ref >59)
Globulin, Total: 3.1 g/dL (ref 1.5–4.5)
Glucose: 80 mg/dL (ref 65–99)
Potassium: 4.2 mmol/L (ref 3.5–5.2)
Sodium: 144 mmol/L (ref 134–144)
Total Protein: 7.4 g/dL (ref 6.0–8.5)

## 2020-04-05 LAB — VITAMIN B12: Vitamin B-12: 838 pg/mL (ref 232–1245)

## 2020-04-05 LAB — VITAMIN D 25 HYDROXY (VIT D DEFICIENCY, FRACTURES): Vit D, 25-Hydroxy: 64.5 ng/mL (ref 30.0–100.0)

## 2020-04-06 LAB — FECAL OCCULT BLOOD, IMMUNOCHEMICAL: IFOBT: NEGATIVE

## 2020-04-12 ENCOUNTER — Ambulatory Visit
Admission: RE | Admit: 2020-04-12 | Discharge: 2020-04-12 | Disposition: A | Discharge status: Home / Self Care | Payer: Medicare Other | Source: Ambulatory Visit | Attending: Nurse Practitioner | Admitting: Nurse Practitioner

## 2020-04-12 ENCOUNTER — Other Ambulatory Visit: Payer: Self-pay

## 2020-04-12 DIAGNOSIS — Z1231 Encounter for screening mammogram for malignant neoplasm of breast: Secondary | ICD-10-CM | POA: Diagnosis not present

## 2020-04-17 ENCOUNTER — Telehealth: Payer: Self-pay

## 2020-04-17 NOTE — Telephone Encounter (Signed)
Left vm for pt to return call for lab results  

## 2020-04-17 NOTE — Telephone Encounter (Signed)
-----   Message from Minette Brine, [place] sent at 04/17/2020  9:43 AM EDT ----- Your kidney functions are stable, have you seen a kidney specialist in the past? HgbA1c is 7.3, are you taking your Tradjenta daily and your glipizide.  Vitamin d is normal. Vitamin B12 is normal.

## 2020-04-18 ENCOUNTER — Other Ambulatory Visit: Payer: Self-pay | Admitting: Nurse Practitioner

## 2020-04-18 DIAGNOSIS — N1832 Chronic kidney disease, stage 3b: Secondary | ICD-10-CM

## 2020-04-18 DIAGNOSIS — E1122 Type 2 diabetes mellitus with diabetic chronic kidney disease: Secondary | ICD-10-CM

## 2020-04-18 DIAGNOSIS — N183 Chronic kidney disease, stage 3 unspecified: Secondary | ICD-10-CM

## 2020-04-19 ENCOUNTER — Other Ambulatory Visit: Payer: Self-pay | Admitting: Nurse Practitioner

## 2020-05-23 ENCOUNTER — Other Ambulatory Visit: Payer: Self-pay

## 2020-05-23 ENCOUNTER — Telehealth: Payer: Self-pay

## 2020-05-23 ENCOUNTER — Ambulatory Visit
Admission: RE | Admit: 2020-05-23 | Discharge: 2020-05-23 | Disposition: A | Discharge status: Home / Self Care | Payer: Medicare Other | Source: Ambulatory Visit | Attending: Nurse Practitioner | Admitting: Nurse Practitioner

## 2020-05-23 DIAGNOSIS — E2839 Other primary ovarian failure: Secondary | ICD-10-CM

## 2020-05-23 DIAGNOSIS — Z780 Asymptomatic menopausal state: Secondary | ICD-10-CM | POA: Diagnosis not present

## 2020-05-23 DIAGNOSIS — M85852 Other specified disorders of bone density and structure, left thigh: Secondary | ICD-10-CM | POA: Diagnosis not present

## 2020-05-23 NOTE — Telephone Encounter (Signed)
-----   Message from Minette Brine, [place] sent at 05/23/2020  3:16 PM EDT ----- Your bone density indicates you are osteopenic which means early bone loss. Make sure you take vitamin d that we have called in (high dose) weekly and at least 1200 mg calcium daily. We will repeat in 2 years.

## 2020-06-04 ENCOUNTER — Other Ambulatory Visit: Payer: Self-pay

## 2020-06-04 MED ORDER — VITAMIN D (ERGOCALCIFEROL) 1.25 MG (50000 UNIT) PO CAPS: 50000.0000 [IU] | ORAL_CAPSULE | ORAL | 1 refills | Status: DC

## 2020-06-11 ENCOUNTER — Other Ambulatory Visit: Payer: Self-pay | Admitting: Internal Medicine

## 2020-07-04 ENCOUNTER — Telehealth: Payer: Self-pay | Admitting: *Deleted

## 2020-07-04 NOTE — Chronic Care Management (AMB) (Signed)
  Chronic Care Management   Note  07/04/2020 Name: Kristen Cannon MRN: [id number] DOB: [birth year]  Kristen Cannon is a 74 y.o. year old female who is a primary care patient of Minette Brine, [place]. I reached out to Kristen Cannon by phone today in response to a referral sent by Kristen Cannon's health plan.     [name] was given information about Chronic Care Management services today including:  1. CCM service includes personalized support from designated clinical staff supervised by her physician, including individualized plan of care and coordination with other care providers 2. 24/7 contact phone numbers for assistance for urgent and routine care needs. 3. Service will only be billed when office clinical staff spend 20 minutes or more in a month to coordinate care. 4. Only one practitioner may furnish and bill the service in a calendar month. 5. The patient may stop CCM services at any time (effective at the end of the month) by phone call to the office staff. 6. The patient will be responsible for cost sharing (co-pay) of up to 20% of the service fee (after annual deductible is met).  Patient did not agree to enrollment in care management services and does not wish to consider at this time.  Follow up plan: The care management team is available to speak with the patient after provider conversation with the patient regarding recommendation for care management engagement and subsequent re-referral to the care management team.   [facility]

## 2020-07-04 NOTE — Chronic Care Management (AMB) (Signed)
°  Chronic Care Management   Outreach Note  07/04/2020 Name: Kristen Cannon MRN: [id number] DOB: 24-Jan-1946  Brent Noto is a 74 y.o. year old female who is a primary care patient of Minette Brine, [place]. I reached out to Marylu Lund by phone today in response to a referral sent by Ms. Sheryle Hail Scow's health plan.     An unsuccessful telephone outreach was attempted today. The patient was referred to the case management team for assistance with care management and care coordination.   Follow Up Plan: A HIPAA compliant phone message was left for the patient providing contact information and requesting a return call. The care management team will reach out to the patient again over the next 7 days. If patient returns call to provider office, please advise to call [place] at [phone].  [facility]

## 2020-07-06 ENCOUNTER — Other Ambulatory Visit: Payer: Self-pay | Admitting: Nurse Practitioner

## 2020-07-09 DIAGNOSIS — N1832 Chronic kidney disease, stage 3b: Secondary | ICD-10-CM | POA: Diagnosis not present

## 2020-07-09 DIAGNOSIS — I129 Hypertensive chronic kidney disease with stage 1 through stage 4 chronic kidney disease, or unspecified chronic kidney disease: Secondary | ICD-10-CM | POA: Diagnosis not present

## 2020-07-09 DIAGNOSIS — E1122 Type 2 diabetes mellitus with diabetic chronic kidney disease: Secondary | ICD-10-CM | POA: Diagnosis not present

## 2020-07-09 DIAGNOSIS — N189 Chronic kidney disease, unspecified: Secondary | ICD-10-CM | POA: Diagnosis not present

## 2020-07-09 DIAGNOSIS — E559 Vitamin D deficiency, unspecified: Secondary | ICD-10-CM | POA: Diagnosis not present

## 2020-07-09 DIAGNOSIS — E785 Hyperlipidemia, unspecified: Secondary | ICD-10-CM | POA: Diagnosis not present

## 2020-07-17 ENCOUNTER — Other Ambulatory Visit: Payer: Self-pay | Admitting: Internal Medicine

## 2020-07-17 DIAGNOSIS — I129 Hypertensive chronic kidney disease with stage 1 through stage 4 chronic kidney disease, or unspecified chronic kidney disease: Secondary | ICD-10-CM

## 2020-07-17 DIAGNOSIS — N1832 Chronic kidney disease, stage 3b: Secondary | ICD-10-CM

## 2020-07-17 DIAGNOSIS — E1122 Type 2 diabetes mellitus with diabetic chronic kidney disease: Secondary | ICD-10-CM

## 2020-07-18 ENCOUNTER — Encounter: Payer: Medicare Other | Admitting: Nurse Practitioner

## 2020-07-18 ENCOUNTER — Encounter: Payer: Self-pay | Admitting: Nurse Practitioner

## 2020-07-18 ENCOUNTER — Other Ambulatory Visit: Payer: Self-pay

## 2020-07-18 ENCOUNTER — Ambulatory Visit (INDEPENDENT_AMBULATORY_CARE_PROVIDER_SITE_OTHER): Payer: Medicare Other | Admitting: Nurse Practitioner

## 2020-07-18 VITALS — BP 132/80 | HR 71 | Temp 98.3°F | Ht 58.6 in | Wt 174.6 lb

## 2020-07-18 DIAGNOSIS — I129 Hypertensive chronic kidney disease with stage 1 through stage 4 chronic kidney disease, or unspecified chronic kidney disease: Secondary | ICD-10-CM

## 2020-07-18 DIAGNOSIS — Z0000 Encounter for general adult medical examination without abnormal findings: Secondary | ICD-10-CM

## 2020-07-18 DIAGNOSIS — N183 Chronic kidney disease, stage 3 unspecified: Secondary | ICD-10-CM | POA: Diagnosis not present

## 2020-07-18 DIAGNOSIS — E1122 Type 2 diabetes mellitus with diabetic chronic kidney disease: Secondary | ICD-10-CM | POA: Diagnosis not present

## 2020-07-18 DIAGNOSIS — Z6835 Body mass index (BMI) 35.0-35.9, adult: Secondary | ICD-10-CM

## 2020-07-18 DIAGNOSIS — E7800 Pure hypercholesterolemia, unspecified: Secondary | ICD-10-CM | POA: Diagnosis not present

## 2020-07-18 DIAGNOSIS — Z23 Encounter for immunization: Secondary | ICD-10-CM

## 2020-07-18 LAB — POCT URINALYSIS DIPSTICK
Bilirubin, UA: NEGATIVE
Blood, UA: NEGATIVE
Glucose, UA: NEGATIVE
Ketones, UA: NEGATIVE
Leukocytes, UA: NEGATIVE
Nitrite, UA: NEGATIVE
Protein, UA: NEGATIVE
Spec Grav, UA: 1.025 (ref 1.010–1.025)
Urobilinogen, UA: 0.2 E.U./dL
pH, UA: 6.5 (ref 5.0–8.0)

## 2020-07-18 LAB — POCT UA - MICROALBUMIN
Albumin/Creatinine Ratio, Urine, POC: <30
Creatinine, POC: 200 mg/dL
Microalbumin Ur, POC: 30 mg/L

## 2020-07-18 MED ORDER — SHINGRIX 50 MCG/0.5ML IM SUSR: 0.5000 mL | Freq: Once | INTRAMUSCULAR | 0 refills | Status: AC

## 2020-07-18 NOTE — Patient Instructions (Addendum)
Health Maintenance, Female Adopting a healthy lifestyle and getting preventive care are important in promoting health and wellness. Ask your health care provider about:  The right schedule for you to have regular tests and exams.  Things you can do on your own to prevent diseases and keep yourself healthy. What should I know about diet, weight, and exercise? Eat a healthy diet   Eat a diet that includes plenty of vegetables, fruits, low-fat dairy products, and lean protein.  Do not eat a lot of foods that are high in solid fats, added sugars, or sodium. Maintain a healthy weight Body mass index (BMI) is used to identify weight problems. It estimates body fat based on height and weight. Your health care provider can help determine your BMI and help you achieve or maintain a healthy weight. Get regular exercise Get regular exercise. This is one of the most important things you can do for your health. Most adults should:  Exercise for at least 150 minutes each week. The exercise should increase your heart rate and make you sweat (moderate-intensity exercise).  Do strengthening exercises at least twice a week. This is in addition to the moderate-intensity exercise.  Spend less time sitting. Even light physical activity can be beneficial. Watch cholesterol and blood lipids Have your blood tested for lipids and cholesterol at 76 years of age, then have this test every 5 years. Have your cholesterol levels checked more often if:  Your lipid or cholesterol levels are high.  You are older than 76 years of age.  You are at high risk for heart disease. What should I know about cancer screening? Depending on your health history and family history, you may need to have cancer screening at various ages. This may include screening for:  Breast cancer.  Cervical cancer.  Colorectal cancer.  Skin cancer.  Lung cancer. What should I know about heart disease, diabetes, and high blood  pressure? Blood pressure and heart disease  High blood pressure causes heart disease and increases the risk of stroke. This is more likely to develop in people who have high blood pressure readings, are of African descent, or are overweight.  Have your blood pressure checked: ? Every 3-5 years if you are 18-39 years of age. ? Every year if you are 40 years old or older. Diabetes Have regular diabetes screenings. This checks your fasting blood sugar level. Have the screening done:  Once every three years after age 40 if you are at a normal weight and have a low risk for diabetes.  More often and at a younger age if you are overweight or have a high risk for diabetes. What should I know about preventing infection? Hepatitis B If you have a higher risk for hepatitis B, you should be screened for this virus. Talk with your health care provider to find out if you are at risk for hepatitis B infection. Hepatitis C Testing is recommended for:  Everyone born from 1945 through 1965.  Anyone with known risk factors for hepatitis C. Sexually transmitted infections (STIs)  Get screened for STIs, including gonorrhea and chlamydia, if: ? You are sexually active and are younger than 76 years of age. ? You are older than 76 years of age and your health care provider tells you that you are at risk for this type of infection. ? Your sexual activity has changed since you were last screened, and you are at increased risk for chlamydia or gonorrhea. Ask your health care provider if   you are at risk.  Ask your health care provider about whether you are at high risk for HIV. Your health care provider may recommend a prescription medicine to help prevent HIV infection. If you choose to take medicine to prevent HIV, you should first get tested for HIV. You should then be tested every 3 months for as long as you are taking the medicine. Pregnancy  If you are about to stop having your period (premenopausal) and  you may become pregnant, seek counseling before you get pregnant.  Take 400 to 800 micrograms (mcg) of folic acid every day if you become pregnant.  Ask for birth control (contraception) if you want to prevent pregnancy. Osteoporosis and menopause Osteoporosis is a disease in which the bones lose minerals and strength with aging. This can result in bone fractures. If you are 39 years old or older, or if you are at risk for osteoporosis and fractures, ask your health care provider if you should:  Be screened for bone loss.  Take a calcium or vitamin D supplement to lower your risk of fractures.  Be given hormone replacement therapy (HRT) to treat symptoms of menopause. Follow these instructions at home: Lifestyle  Do not use any products that contain nicotine or tobacco, such as cigarettes, e-cigarettes, and chewing tobacco. If you need help quitting, ask your health care provider.  Do not use street drugs.  Do not share needles.  Ask your health care provider for help if you need support or information about quitting drugs. Alcohol use  Do not drink alcohol if: ? Your health care provider tells you not to drink. ? You are pregnant, may be pregnant, or are planning to become pregnant.  If you drink alcohol: ? Limit how much you use to 0-1 drink a day. ? Limit intake if you are breastfeeding.  Be aware of how much alcohol is in your drink. In the [place] one drink equals one 12 oz bottle of beer (355 mL), one 5 oz glass of wine (148 mL), or one 1 oz glass of hard liquor (44 mL). General instructions  Schedule regular health, dental, and eye exams.  Stay current with your vaccines.  Tell your health care provider if: ? You often feel depressed. ? You have ever been abused or do not feel safe at home. Summary  Adopting a healthy lifestyle and getting preventive care are important in promoting health and wellness.  Follow your health care provider's instructions about healthy  diet, exercising, and getting tested or screened for diseases.  Follow your health care provider's instructions on monitoring your cholesterol and blood pressure. This information is not intended to replace advice given to you by your health care provider. Make sure you discuss any questions you have with your health care provider. Document Revised: 09/22/2018 Document Reviewed: 09/22/2018 Elsevier Patient Education  2020 [employer].  There is a mammogram screening in [place] on October 9th at [place] for those with and without insurance.  Call [phone] for an appt

## 2020-07-18 NOTE — Progress Notes (Signed)
I,Yamilka Roman [employer] as a Education administrator for [employer], FNP.,have documented all relevant documentation on the behalf of Minette Brine, FNP,as directed by  Minette Brine, FNP while in the presence of Minette Brine, [place]. This visit occurred during the SARS-CoV-2 public health emergency.  Safety protocols were in place, including screening questions prior to the visit, additional usage of staff PPE, and extensive cleaning of exam room while observing appropriate contact time as indicated for disinfecting solutions.  Subjective:     Patient ID: Kristen Cannon , female    DOB: 1946-02-23 , 74 y.o.   MRN: [id number]   Chief Complaint  Patient presents with  . Annual Exam    HPI  Here for HM  Wt Readings from Last 3 Encounters: 07/18/20 : 174 lb 9.6 oz (79.2 kg) 04/04/20 : 172 lb 9.6 oz (78.3 kg) 01/03/20 : 175 lb 3.2 oz (79.5 kg)     Past Medical History:  Diagnosis Date  . Diabetes mellitus   . High cholesterol   . Hypertension      Family History  Problem Relation Age of Onset  . Breast cancer Sister   . Breast cancer Daughter   . Early death Mother   . Early death Father      Current Outpatient Medications:  .  aspirin EC 81 MG tablet, Take 81 mg by mouth every morning., Disp: , Rfl:  .  atorvastatin (LIPITOR) 20 MG tablet, TAKE 1 TABLET(20 MG) BY MOUTH DAILY, Disp: 90 tablet, Rfl: 0 .  diclofenac Sodium (VOLTAREN) 1 % GEL, Apply 2 g topically 4 (four) times daily., Disp: 100 g, Rfl: 3 .  glipiZIDE (GLUCOTROL) 10 MG tablet, TAKE 1 TABLET BY MOUTH EVERY MORNING BEFORE A MEAL AND BEFORE THE EVENING MEAL, Disp: 180 tablet, Rfl: 1 .  linaclotide (LINZESS) 72 MCG capsule, Take 1 capsule (72 mcg total) by mouth daily before breakfast. As needed, Disp: 90 capsule, Rfl: 1 .  losartan-hydrochlorothiazide (HYZAAR) 100-12.5 MG tablet, Take 1 tablet by mouth daily., Disp: 90 tablet, Rfl: 1 .  TRADJENTA 5 MG TABS tablet, TAKE 1 TABLET BY MOUTH DAILY, Disp: 90 tablet, Rfl:  0 .  Vitamin D, Ergocalciferol, (DRISDOL) 1.25 MG (50000 UNIT) CAPS capsule, Take 1 capsule (50,000 Units total) by mouth once a week., Disp: 12 capsule, Rfl: 1 .  cloNIDine (CATAPRES) 0.2 MG tablet, TAKE 1 TABLET(0.2 MG) BY MOUTH DAILY, Disp: 90 tablet, Rfl: 0   No Known Allergies    The patient states she uses status post hysterectomy.  Negative for: breast discharge, breast lump(s), breast pain and breast self exam. Associated symptoms include abnormal vaginal bleeding. Pertinent negatives include abnormal bleeding (hematology), anxiety, decreased libido, depression, difficulty falling sleep, dyspareunia, history of infertility, nocturia, sexual dysfunction, sleep disturbances, urinary incontinence, urinary urgency, vaginal discharge and vaginal itching. Diet regular; she tries not to eat as much especially bread and potatoes. The patient states her exercise level is minimal - she will walk in her building. She is having back pain.    The patient's tobacco use is:  Social History   Tobacco Use  Smoking Status Former Smoker  . Packs/day: 1.00  . Years: 12.00  . Pack years: 12.00  . Types: Cigars  . Quit date: 2007  . Years since quitting: 14.8  Smokeless Tobacco Never Used  Tobacco Comment   1 cigar a week   She has been exposed to passive smoke. The patient's alcohol use is:  Social History   Substance and Sexual Activity  Alcohol Use Never      Review of Systems  Constitutional: Negative.   HENT: Negative.   Eyes: Negative.   Respiratory: Negative.  Negative for cough.   Cardiovascular: Negative.  Negative for chest pain, palpitations and leg swelling.  Gastrointestinal: Negative.   Endocrine: Negative.   Genitourinary: Negative.   Musculoskeletal: Negative.   Skin: Negative.   Allergic/Immunologic: Negative.   Neurological: Negative.   Hematological: Negative.   Psychiatric/Behavioral: Negative.      Today's Vitals   07/18/20 1110  BP: 132/80  Pulse: 71  Temp:  98.3 F (36.8 C)  TempSrc: Oral  Weight: 174 lb 9.6 oz (79.2 kg)  Height: 4' 10.6" (1.488 m)  PainSc: 0-No pain   Body mass index is 35.75 kg/m.   Objective:  Physical Exam Constitutional:      General: She is not in acute distress.    Appearance: Normal appearance. She is well-developed. She is obese.  HENT:     Head: Normocephalic and atraumatic.     Right Ear: Hearing, tympanic membrane, ear canal and external ear normal. There is no impacted cerumen.     Left Ear: Hearing, tympanic membrane, ear canal and external ear normal. There is no impacted cerumen.     Nose:     Comments: Deferred - masked    Mouth/Throat:     Comments: Deferred - masked  Eyes:     General: Lids are normal.     Extraocular Movements: Extraocular movements intact.     Conjunctiva/sclera: Conjunctivae normal.     Pupils: Pupils are equal, round, and reactive to light.     Funduscopic exam:    Right eye: No papilledema.        Left eye: No papilledema.  Neck:     Thyroid: No thyroid mass.     Vascular: No carotid bruit.  Cardiovascular:     Rate and Rhythm: Normal rate and regular rhythm.     Pulses: Normal pulses.     Heart sounds: Normal heart sounds. No murmur heard.   Pulmonary:     Effort: Pulmonary effort is normal.     Breath sounds: Normal breath sounds.  Chest:     Chest wall: No mass.     Breasts: Tanner Score is 5. Breasts are symmetrical.        Right: Normal. No tenderness.        Left: No tenderness.  Abdominal:     General: Abdomen is flat. Bowel sounds are normal. There is no distension.     Palpations: Abdomen is soft.     Tenderness: There is no abdominal tenderness.  Genitourinary:    Rectum: Guaiac result negative.  Musculoskeletal:        General: No swelling. Normal range of motion.     Cervical back: Full passive range of motion without pain, normal range of motion and neck supple.     Right lower leg: No edema.     Left lower leg: No edema.  Lymphadenopathy:      Upper Body:     Right upper body: No supraclavicular or axillary adenopathy.     Left upper body: No supraclavicular or axillary adenopathy.  Skin:    General: Skin is warm and dry.     Capillary Refill: Capillary refill takes less than 2 seconds.  Neurological:     General: No focal deficit present.     Mental Status: She is alert and oriented to person, place, and time.  Cranial Nerves: No cranial nerve deficit.     Sensory: No sensory deficit.  Psychiatric:        Mood and Affect: Mood normal.        Behavior: Behavior normal.        Thought Content: Thought content normal.        Judgment: Judgment normal.         Assessment And Plan:     1. Encounter for general adult medical examination w/o abnormal findings . Behavior modifications discussed and diet history reviewed.   . Pt will continue to exercise regularly and modify diet with low GI, plant based foods and decrease intake of processed foods.  . Recommend intake of daily multivitamin, Vitamin D, and calcium.  . Recommend mammogram and colonoscopy (both are up to date) for preventive screenings, as well as recommend immunizations that include influenza (given today), TDAP (up to date), and Shingles (sent to pharmacy)  2. Encounter for immunization  She is encouraged to get the shingles vaccine at least 2-3 weeks after her flu vaccine or covid booster - Zoster Vaccine Adjuvanted [facility]) injection; Inject 0.5 mLs into the muscle once for 1 dose.  Dispense: 0.5 mL; Refill: 0  3. Diabetes mellitus with stage 3 chronic kidney disease (HCC)  Chronic, stable  She is to continue with current medications, she is on tradjenta due to her kidney functions - POCT Urinalysis Dipstick (81002) - POCT UA - Microalbumin - Hemoglobin A1c  4. Pure hypercholesterolemia  Chronic, controlled  Continue with current medications, tolerating statins well - Lipid panel  5. Hypertensive nephropathy  Chronic, fair  control  Continue current medications  EKG done with Sinus Rhythm poor r wave progression nonspecific, no acute problems - EKG 12-Lead - CBC - CMP14+EGFR  6. Body mass index (BMI) of 35.0 to 35.9  Discussed the importance of increasing physical activity to at least 150 minutes weekly even if chair exercises.  She is encouraged to strive for BMI less than 30 to decrease cardiac risk.      Patient was given opportunity to ask questions. Patient verbalized understanding of the plan and was able to repeat key elements of the plan. All questions were answered to their satisfaction.    Teola Bradley, FNP, have reviewed all documentation for this visit. The documentation on 08/05/20 for the exam, diagnosis, procedures, and orders are all accurate and complete.  THE PATIENT IS ENCOURAGED TO PRACTICE SOCIAL DISTANCING DUE TO THE COVID-19 PANDEMIC.

## 2020-07-19 LAB — CMP14+EGFR
ALT: 25 IU/L (ref 0–32)
AST: 23 IU/L (ref 0–40)
Albumin/Globulin Ratio: 1.3 (ref 1.2–2.2)
Albumin: 4.3 g/dL (ref 3.7–4.7)
Alkaline Phosphatase: 63 IU/L (ref 44–121)
BUN/Creatinine Ratio: 19 (ref 12–28)
BUN: 21 mg/dL (ref 8–27)
Bilirubin Total: 0.4 mg/dL (ref 0.0–1.2)
CO2: 25 mmol/L (ref 20–29)
Calcium: 10 mg/dL (ref 8.7–10.3)
Chloride: 102 mmol/L (ref 96–106)
Creatinine, Ser: 1.13 mg/dL — ABNORMAL HIGH (ref 0.57–1.00)
GFR calc Af Amer: 55 mL/min/{1.73_m2} — ABNORMAL LOW (ref >59)
GFR calc non Af Amer: 47 mL/min/{1.73_m2} — ABNORMAL LOW (ref >59)
Globulin, Total: 3.2 g/dL (ref 1.5–4.5)
Glucose: 77 mg/dL (ref 65–99)
Potassium: 3.9 mmol/L (ref 3.5–5.2)
Sodium: 141 mmol/L (ref 134–144)
Total Protein: 7.5 g/dL (ref 6.0–8.5)

## 2020-07-19 LAB — CBC
Hematocrit: 35.2 % (ref 34.0–46.6)
Hemoglobin: 11.2 g/dL (ref 11.1–15.9)
MCH: 27.9 pg (ref 26.6–33.0)
MCHC: 31.8 g/dL (ref 31.5–35.7)
MCV: 88 fL (ref 79–97)
Platelets: 271 10*3/uL (ref 150–450)
RBC: 4.02 x10E6/uL (ref 3.77–5.28)
RDW: 13.2 % (ref 11.7–15.4)
WBC: 5.5 10*3/uL (ref 3.4–10.8)

## 2020-07-19 LAB — LIPID PANEL
Chol/HDL Ratio: 2.1 ratio (ref 0.0–4.4)
Cholesterol, Total: 127 mg/dL (ref 100–199)
HDL: 61 mg/dL (ref >39)
LDL Chol Calc (NIH): 50 mg/dL (ref 0–99)
Triglycerides: 83 mg/dL (ref 0–149)
VLDL Cholesterol Cal: 16 mg/dL (ref 5–40)

## 2020-07-19 LAB — HEMOGLOBIN A1C
Est. average glucose Bld gHb Est-mCnc: 163 mg/dL
Hgb A1c MFr Bld: 7.3 % — ABNORMAL HIGH (ref 4.8–5.6)

## 2020-07-23 DIAGNOSIS — I129 Hypertensive chronic kidney disease with stage 1 through stage 4 chronic kidney disease, or unspecified chronic kidney disease: Secondary | ICD-10-CM | POA: Diagnosis not present

## 2020-07-24 ENCOUNTER — Ambulatory Visit
Admission: RE | Admit: 2020-07-24 | Discharge: 2020-07-24 | Disposition: A | Discharge status: Home / Self Care | Payer: Medicare Other | Source: Ambulatory Visit | Attending: Internal Medicine | Admitting: Internal Medicine

## 2020-07-24 DIAGNOSIS — E1122 Type 2 diabetes mellitus with diabetic chronic kidney disease: Secondary | ICD-10-CM

## 2020-07-24 DIAGNOSIS — N1832 Chronic kidney disease, stage 3b: Secondary | ICD-10-CM

## 2020-07-24 DIAGNOSIS — I129 Hypertensive chronic kidney disease with stage 1 through stage 4 chronic kidney disease, or unspecified chronic kidney disease: Secondary | ICD-10-CM

## 2020-07-24 DIAGNOSIS — N189 Chronic kidney disease, unspecified: Secondary | ICD-10-CM | POA: Diagnosis not present

## 2020-07-28 ENCOUNTER — Other Ambulatory Visit: Payer: Self-pay | Admitting: Nurse Practitioner

## 2020-07-30 ENCOUNTER — Other Ambulatory Visit: Payer: Self-pay | Admitting: Nurse Practitioner

## 2020-08-15 ENCOUNTER — Other Ambulatory Visit: Payer: Self-pay | Admitting: Nurse Practitioner

## 2020-08-28 IMAGING — MG DIGITAL SCREENING BILAT W/ TOMO W/ CAD
6 of 10 series · 6 of 30 positions shown · non-contrast
Comparison: Previous exam(s).

CLINICAL DATA: Screening.

EXAM:
DIGITAL SCREENING BILATERAL MAMMOGRAM WITH TOMO AND CAD

[L MLO synth-2D]
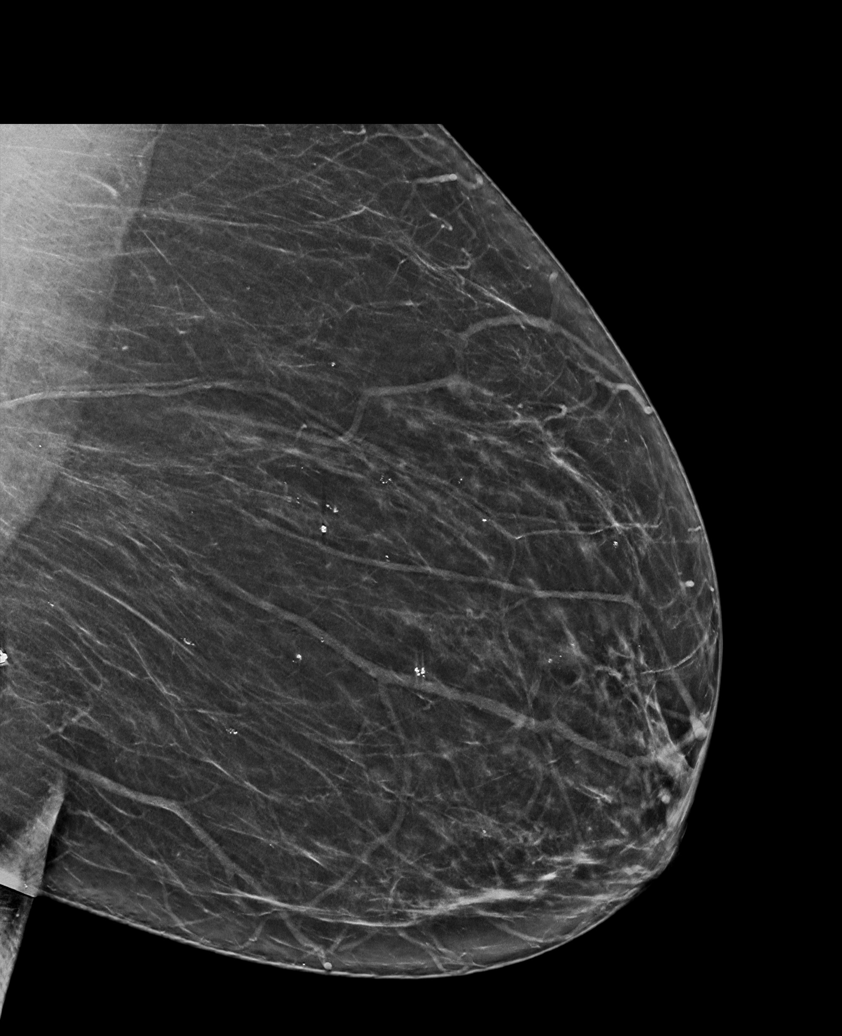

[R CV synth-2D]
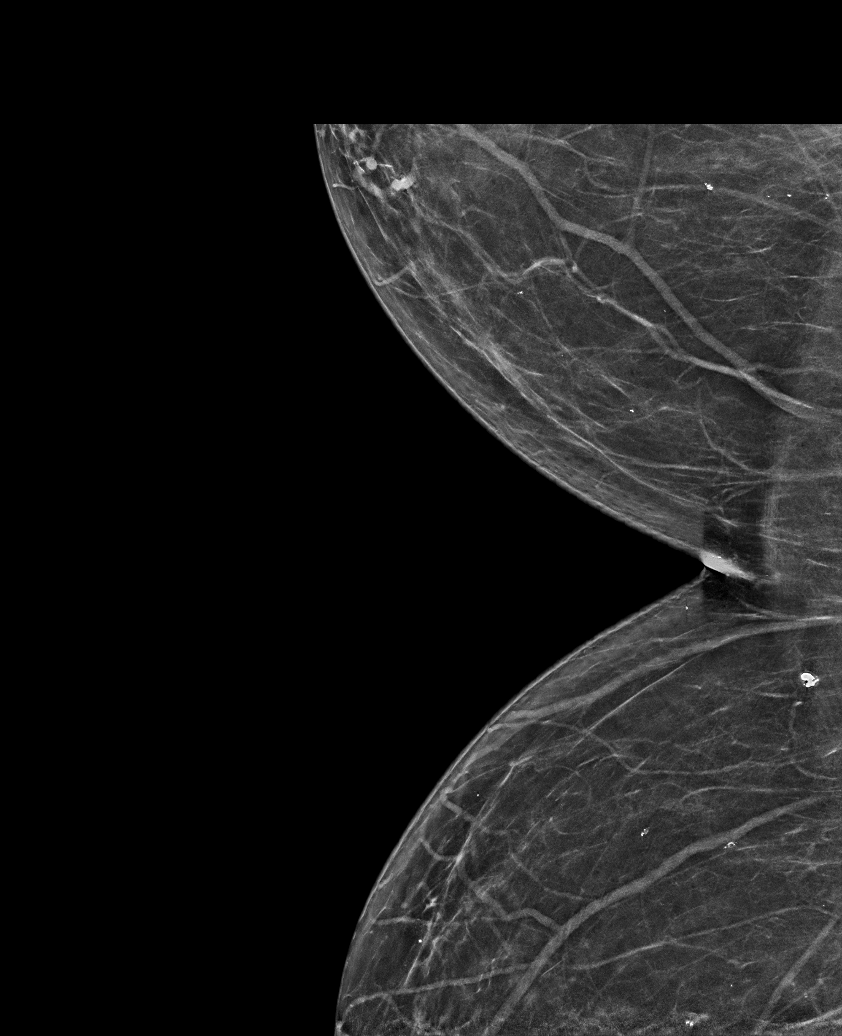

[R CC synth-2D]
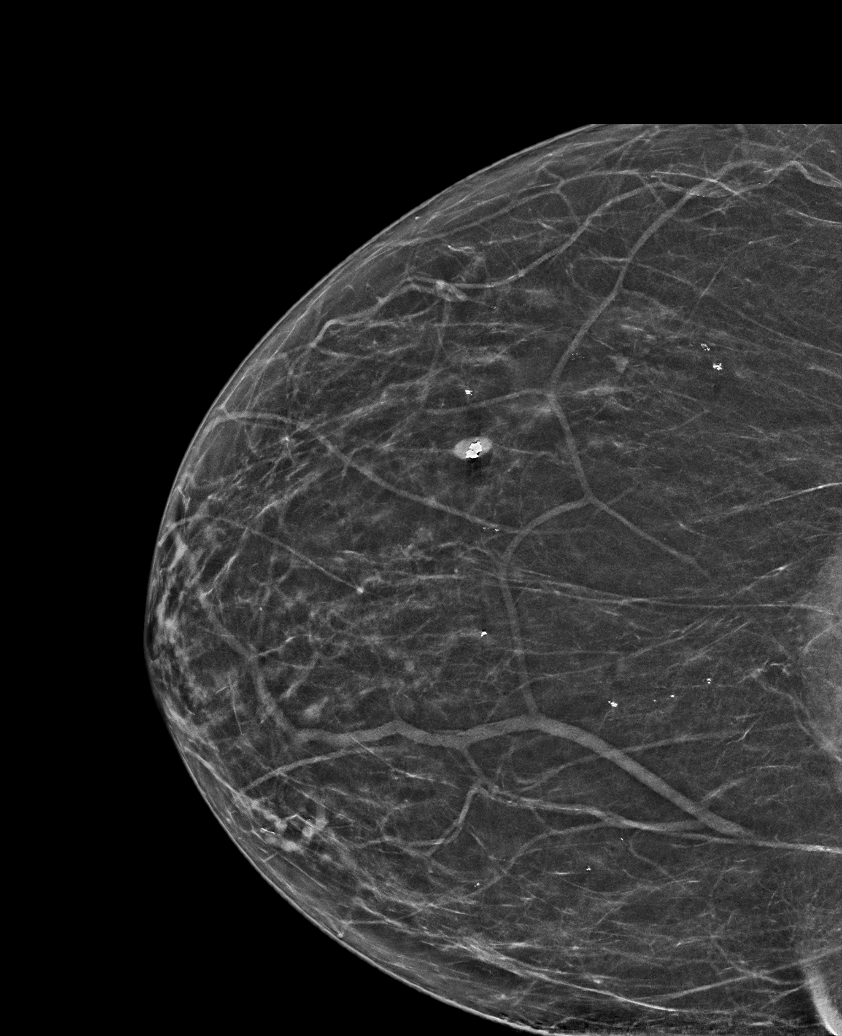

[L CC synth-2D]
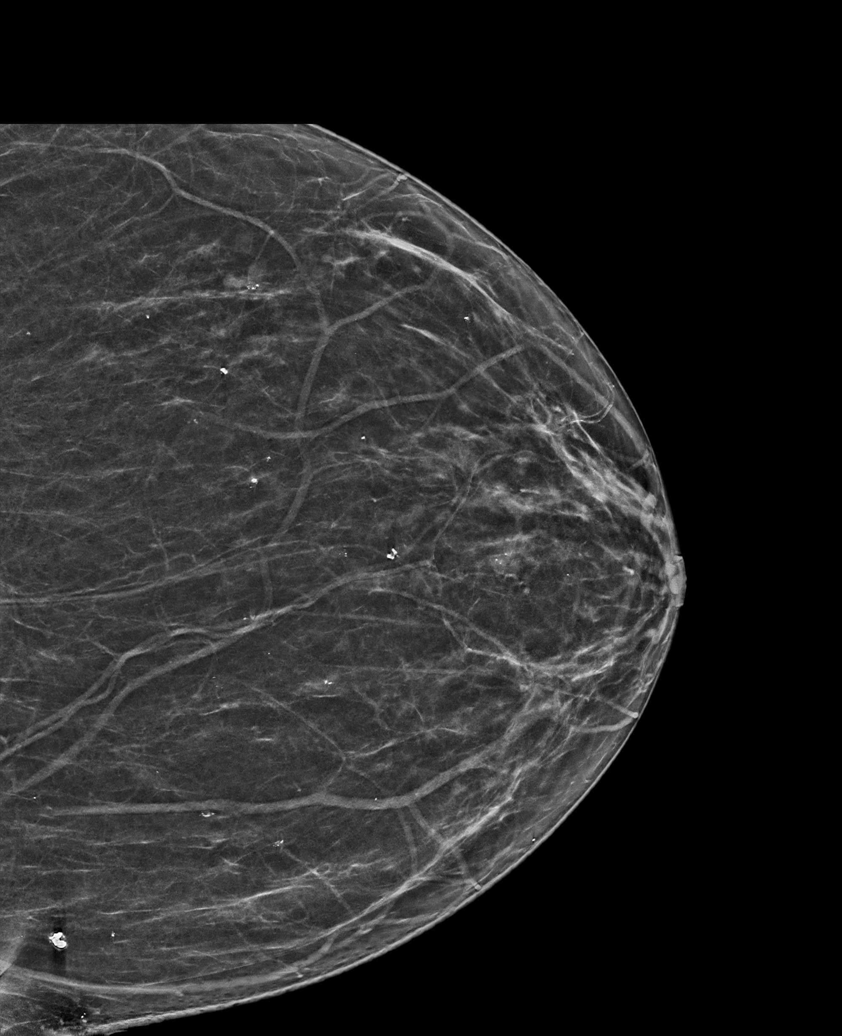

[R MLO synth-2D]
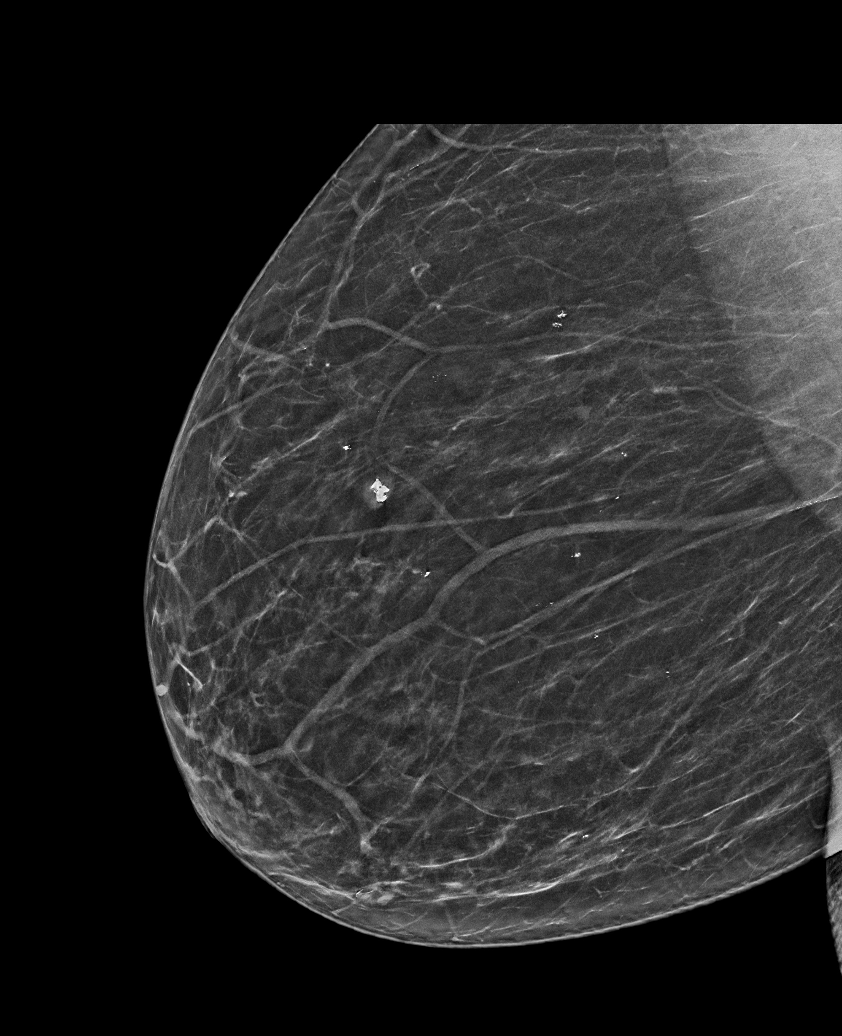

[R CC tomo · tomo slice 32/63.0]
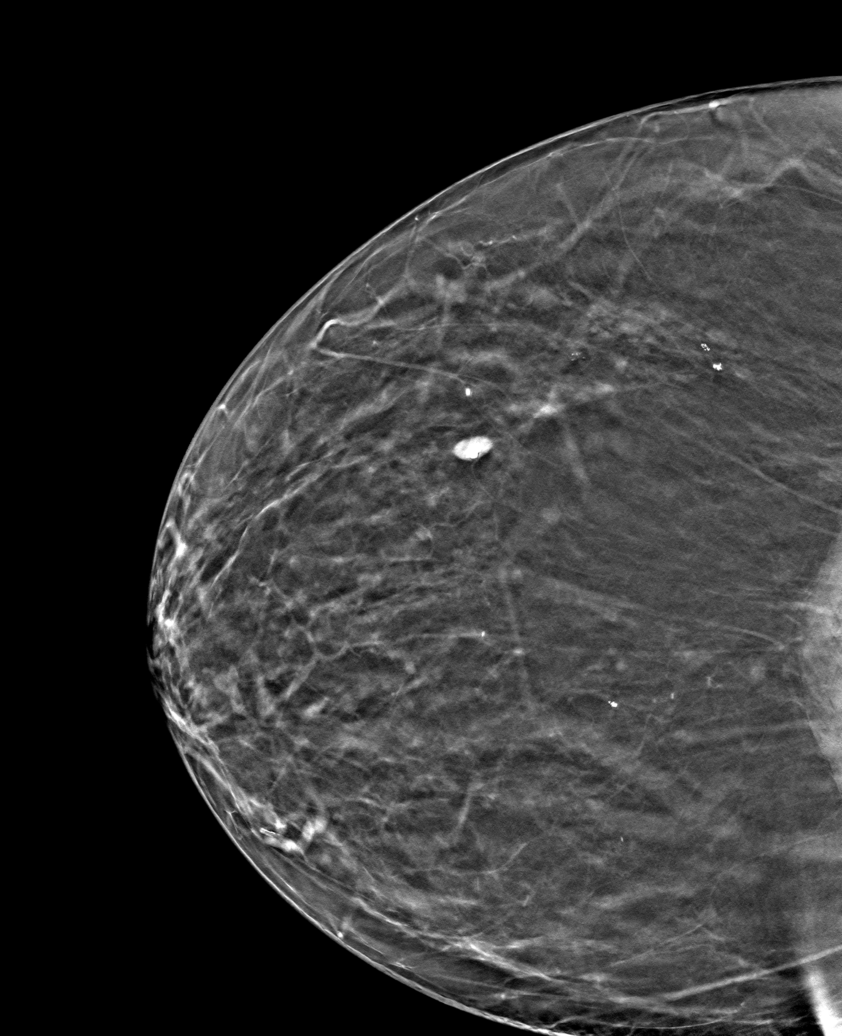

[6 of 30 positions shown; findings below may reference images not displayed]

ACR Breast Density Category b: There are scattered areas of
fibroglandular density.
FINDINGS: There are no findings suspicious for malignancy. Images were
processed with CAD.
IMPRESSION: No mammographic evidence of malignancy. A result letter of this
screening mammogram will be mailed directly to the patient.

RECOMMENDATION:
Screening mammogram in one year. (Code:[id number])

BI-RADS CATEGORY  1: Negative.

## 2020-09-26 ENCOUNTER — Other Ambulatory Visit: Payer: Self-pay | Admitting: Internal Medicine

## 2020-09-26 ENCOUNTER — Other Ambulatory Visit: Payer: Self-pay | Admitting: Nurse Practitioner

## 2020-09-27 DIAGNOSIS — Z961 Presence of intraocular lens: Secondary | ICD-10-CM | POA: Diagnosis not present

## 2020-09-27 DIAGNOSIS — H04123 Dry eye syndrome of bilateral lacrimal glands: Secondary | ICD-10-CM | POA: Diagnosis not present

## 2020-09-27 DIAGNOSIS — E119 Type 2 diabetes mellitus without complications: Secondary | ICD-10-CM | POA: Diagnosis not present

## 2020-09-27 DIAGNOSIS — H40013 Open angle with borderline findings, low risk, bilateral: Secondary | ICD-10-CM | POA: Diagnosis not present

## 2020-10-18 ENCOUNTER — Ambulatory Visit: Payer: Medicare Other | Admitting: Nurse Practitioner

## 2020-11-15 ENCOUNTER — Other Ambulatory Visit: Payer: Self-pay | Admitting: Nurse Practitioner

## 2020-12-03 ENCOUNTER — Other Ambulatory Visit: Payer: Self-pay | Admitting: Nurse Practitioner

## 2020-12-13 ENCOUNTER — Other Ambulatory Visit: Payer: Self-pay

## 2020-12-13 ENCOUNTER — Ambulatory Visit (INDEPENDENT_AMBULATORY_CARE_PROVIDER_SITE_OTHER): Payer: Medicare Other | Admitting: Nurse Practitioner

## 2020-12-13 ENCOUNTER — Encounter: Payer: Medicare Other | Admitting: Nurse Practitioner

## 2020-12-13 ENCOUNTER — Ambulatory Visit (INDEPENDENT_AMBULATORY_CARE_PROVIDER_SITE_OTHER): Payer: Medicare Other

## 2020-12-13 ENCOUNTER — Encounter: Payer: Self-pay | Admitting: Nurse Practitioner

## 2020-12-13 VITALS — BP 124/78 | HR 87 | Temp 98.2°F | Ht 59.0 in | Wt 179.2 lb

## 2020-12-13 DIAGNOSIS — K5900 Constipation, unspecified: Secondary | ICD-10-CM

## 2020-12-13 DIAGNOSIS — I129 Hypertensive chronic kidney disease with stage 1 through stage 4 chronic kidney disease, or unspecified chronic kidney disease: Secondary | ICD-10-CM

## 2020-12-13 DIAGNOSIS — N1832 Chronic kidney disease, stage 3b: Secondary | ICD-10-CM

## 2020-12-13 DIAGNOSIS — E7800 Pure hypercholesterolemia, unspecified: Secondary | ICD-10-CM | POA: Diagnosis not present

## 2020-12-13 DIAGNOSIS — E1122 Type 2 diabetes mellitus with diabetic chronic kidney disease: Secondary | ICD-10-CM | POA: Diagnosis not present

## 2020-12-13 DIAGNOSIS — Z0000 Encounter for general adult medical examination without abnormal findings: Secondary | ICD-10-CM | POA: Diagnosis not present

## 2020-12-13 DIAGNOSIS — N183 Chronic kidney disease, stage 3 unspecified: Secondary | ICD-10-CM

## 2020-12-13 MED ORDER — LOSARTAN POTASSIUM-HCTZ 100-12.5 MG PO TABS: 1.0000 | ORAL_TABLET | Freq: Every day | ORAL | 1 refills | Status: DC

## 2020-12-13 MED ORDER — LINACLOTIDE 72 MCG PO CAPS: 72.0000 ug | ORAL_CAPSULE | Freq: Every day | ORAL | 1 refills | Status: DC

## 2020-12-13 MED ORDER — GLIPIZIDE 10 MG PO TABS: ORAL_TABLET | ORAL | 1 refills | Status: DC

## 2020-12-13 MED ORDER — ATORVASTATIN CALCIUM 20 MG PO TABS: ORAL_TABLET | ORAL | 1 refills | Status: DC

## 2020-12-13 NOTE — Patient Instructions (Signed)

## 2020-12-13 NOTE — Patient Instructions (Signed)
Kristen Cannon , Thank you for taking time to come for your Medicare Wellness Visit. I appreciate your ongoing commitment to your health goals. Please review the following plan we discussed and let me know if I can assist you in the future.   Screening recommendations/referrals: Colonoscopy: not required Mammogram: completed 04/12/2020 Bone Density: completed 05/23/2020 Recommended yearly ophthalmology/optometry visit for glaucoma screening and checkup Recommended yearly dental visit for hygiene and checkup  Vaccinations: Influenza vaccine: completed per patient Pneumococcal vaccine: completed 09/26/2014 Tdap vaccine: completed 01/08/2018, due 01/09/2028 Shingles vaccine: completed   Covid-19: 08/06/2020, 12/21/2019, 11/26/2019  Advanced directives: Advance directive discussed with you today. Even though you declined this today please call our office should you change your mind and we can give you the proper paperwork for you to fill out.  Conditions/risks identified: none  Next appointment: Follow up in one year for your annual wellness visit    Preventive Care 65 Years and Older, Female Preventive care refers to lifestyle choices and visits with your health care provider that can promote health and wellness. What does preventive care include?  A yearly physical exam. This is also called an annual well check.  Dental exams once or twice a year.  Routine eye exams. Ask your health care provider how often you should have your eyes checked.  Personal lifestyle choices, including:  Daily care of your teeth and gums.  Regular physical activity.  Eating a healthy diet.  Avoiding tobacco and drug use.  Limiting alcohol use.  Practicing safe sex.  Taking low-dose aspirin every day.  Taking vitamin and mineral supplements as recommended by your health care provider. What happens during an annual well check? The services and screenings done by your health care provider during your  annual well check will depend on your age, overall health, lifestyle risk factors, and family history of disease. Counseling  Your health care provider may ask you questions about your:  Alcohol use.  Tobacco use.  Drug use.  Emotional well-being.  Home and relationship well-being.  Sexual activity.  Eating habits.  History of falls.  Memory and ability to understand (cognition).  Work and work Statistician.  Reproductive health. Screening  You may have the following tests or measurements:  Height, weight, and BMI.  Blood pressure.  Lipid and cholesterol levels. These may be checked every 5 years, or more frequently if you are over 71 years old.  Skin check.  Lung cancer screening. You may have this screening every year starting at age 51 if you have a 30-pack-year history of smoking and currently smoke or have quit within the past 15 years.  Fecal occult blood test (FOBT) of the stool. You may have this test every year starting at age 27.  Flexible sigmoidoscopy or colonoscopy. You may have a sigmoidoscopy every 5 years or a colonoscopy every 10 years starting at age 68.  Hepatitis C blood test.  Hepatitis B blood test.  Sexually transmitted disease (STD) testing.  Diabetes screening. This is done by checking your blood sugar (glucose) after you have not eaten for a while (fasting). You may have this done every 1-3 years.  Bone density scan. This is done to screen for osteoporosis. You may have this done starting at age 10.  Mammogram. This may be done every 1-2 years. Talk to your health care provider about how often you should have regular mammograms. Talk with your health care provider about your test results, treatment options, and if necessary, the need for more  tests. Vaccines  Your health care provider may recommend certain vaccines, such as:  Influenza vaccine. This is recommended every year.  Tetanus, diphtheria, and acellular pertussis (Tdap, Td)  vaccine. You may need a Td booster every 10 years.  Zoster vaccine. You may need this after age 37.  Pneumococcal 13-valent conjugate (PCV13) vaccine. One dose is recommended after age 43.  Pneumococcal polysaccharide (PPSV23) vaccine. One dose is recommended after age 34. Talk to your health care provider about which screenings and vaccines you need and how often you need them. This information is not intended to replace advice given to you by your health care provider. Make sure you discuss any questions you have with your health care provider. Document Released: 10/26/2015 Document Revised: 06/18/2016 Document Reviewed: 07/31/2015 Elsevier Interactive Patient Education  2017 [employer] Prevention in the Home Falls can cause injuries. They can happen to people of all ages. There are many things you can do to make your home safe and to help prevent falls. What can I do on the outside of my home?  Regularly fix the edges of walkways and driveways and fix any cracks.  Remove anything that might make you trip as you walk through a door, such as a raised step or threshold.  Trim any bushes or trees on the path to your home.  Use bright outdoor lighting.  Clear any walking paths of anything that might make someone trip, such as rocks or tools.  Regularly check to see if handrails are loose or broken. Make sure that both sides of any steps have handrails.  Any raised decks and porches should have guardrails on the edges.  Have any leaves, snow, or ice cleared regularly.  Use sand or salt on walking paths during winter.  Clean up any spills in your garage right away. This includes oil or grease spills. What can I do in the bathroom?  Use night lights.  Install grab bars by the toilet and in the tub and shower. Do not use towel bars as grab bars.  Use non-skid mats or decals in the tub or shower.  If you need to sit down in the shower, use a plastic, non-slip  stool.  Keep the floor dry. Clean up any water that spills on the floor as soon as it happens.  Remove soap buildup in the tub or shower regularly.  Attach bath mats securely with double-sided non-slip rug tape.  Do not have throw rugs and other things on the floor that can make you trip. What can I do in the bedroom?  Use night lights.  Make sure that you have a light by your bed that is easy to reach.  Do not use any sheets or blankets that are too big for your bed. They should not hang down onto the floor.  Have a firm chair that has side arms. You can use this for support while you get dressed.  Do not have throw rugs and other things on the floor that can make you trip. What can I do in the kitchen?  Clean up any spills right away.  Avoid walking on wet floors.  Keep items that you use a lot in easy-to-reach places.  If you need to reach something above you, use a strong step stool that has a grab bar.  Keep electrical cords out of the way.  Do not use floor polish or wax that makes floors slippery. If you must use wax, use non-skid floor  wax.  Do not have throw rugs and other things on the floor that can make you trip. What can I do with my stairs?  Do not leave any items on the stairs.  Make sure that there are handrails on both sides of the stairs and use them. Fix handrails that are broken or loose. Make sure that handrails are as long as the stairways.  Check any carpeting to make sure that it is firmly attached to the stairs. Fix any carpet that is loose or worn.  Avoid having throw rugs at the top or bottom of the stairs. If you do have throw rugs, attach them to the floor with carpet tape.  Make sure that you have a light switch at the top of the stairs and the bottom of the stairs. If you do not have them, ask someone to add them for you. What else can I do to help prevent falls?  Wear shoes that:  Do not have high heels.  Have rubber bottoms.  Are  comfortable and fit you well.  Are closed at the toe. Do not wear sandals.  If you use a stepladder:  Make sure that it is fully opened. Do not climb a closed stepladder.  Make sure that both sides of the stepladder are locked into place.  Ask someone to hold it for you, if possible.  Clearly mark and make sure that you can see:  Any grab bars or handrails.  First and last steps.  Where the edge of each step is.  Use tools that help you move around (mobility aids) if they are needed. These include:  Canes.  Walkers.  Scooters.  Crutches.  Turn on the lights when you go into a dark area. Replace any light bulbs as soon as they burn out.  Set up your furniture so you have a clear path. Avoid moving your furniture around.  If any of your floors are uneven, fix them.  If there are any pets around you, be aware of where they are.  Review your medicines with your doctor. Some medicines can make you feel dizzy. This can increase your chance of falling. Ask your doctor what other things that you can do to help prevent falls. This information is not intended to replace advice given to you by your health care provider. Make sure you discuss any questions you have with your health care provider. Document Released: 07/26/2009 Document Revised: 03/06/2016 Document Reviewed: 11/03/2014 Elsevier Interactive Patient Education  2017 [employer].

## 2020-12-13 NOTE — Progress Notes (Signed)
This visit occurred during the SARS-CoV-2 public health emergency.  Safety protocols were in place, including screening questions prior to the visit, additional usage of staff PPE, and extensive cleaning of exam room while observing appropriate contact time as indicated for disinfecting solutions.  Subjective:   Kristen Cannon is a 75 y.o. female who presents for Medicare Annual (Subsequent) preventive examination.  Review of Systems     Cardiac Risk Factors include: advanced age (>40men, >22 women);diabetes mellitus;hypertension;obesity (BMI >30kg/m2);sedentary lifestyle     Objective:    Today's Vitals   12/13/20 1003  BP: 124/78  Pulse: 87  Temp: 98.2 F (36.8 C)  TempSrc: Oral  SpO2: 92%  Weight: 179 lb 3.2 oz (81.3 kg)  Height: 4\' 11"  (1.499 m)   Body mass index is 36.19 kg/m.  Advanced Directives 12/13/2020 12/07/2019 07/14/2019  Does Patient Have a Medical Advance Directive? No No No  Would patient like information on creating a medical advance directive? No - Patient declined - Yes (MAU/Ambulatory/Procedural Areas - Information given)    Current Medications (verified) Outpatient Encounter Medications as of 12/13/2020  Medication Sig  . aspirin EC 81 MG tablet Take 81 mg by mouth every morning.  . TRADJENTA 5 MG TABS tablet TAKE 1 TABLET BY MOUTH DAILY  . Vitamin D, Ergocalciferol, (DRISDOL) 1.25 MG (50000 UNIT) CAPS capsule TAKE 1 CAPSULE BY MOUTH 1 TIME A WEEK  . [DISCONTINUED] atorvastatin (LIPITOR) 20 MG tablet TAKE 1 TABLET(20 MG) BY MOUTH DAILY  . [DISCONTINUED] glipiZIDE (GLUCOTROL) 10 MG tablet TAKE 1 TABLET BY MOUTH EVERY MORNING BEFORE A MEAL AND BEFORE THE EVENING MEAL  . [DISCONTINUED] linaclotide (LINZESS) 72 MCG capsule Take 1 capsule (72 mcg total) by mouth daily before breakfast. As needed  . [DISCONTINUED] losartan-hydrochlorothiazide (HYZAAR) 100-12.5 MG tablet Take 1 tablet by mouth daily.  . diclofenac Sodium (VOLTAREN) 1 % GEL Apply 2 g  topically 4 (four) times daily. (Patient not taking: Reported on 12/13/2020)  . [DISCONTINUED] cloNIDine (CATAPRES) 0.2 MG tablet TAKE 1 TABLET(0.2 MG) BY MOUTH DAILY (Patient not taking: Reported on 12/13/2020)   No facility-administered encounter medications on file as of 12/13/2020.    Allergies (verified) Patient has no known allergies.   History: Past Medical History:  Diagnosis Date  . Diabetes mellitus   . High cholesterol   . Hypertension    Past Surgical History:  Procedure Laterality Date  . CATARACT EXTRACTION Left 12/2018   DR GROAT  . PARTIAL HYSTERECTOMY  1974   Family History  Problem Relation Age of Onset  . Breast cancer Sister   . Breast cancer Daughter   . Early death Mother   . Early death Father    Social History   Socioeconomic History  . Marital status: Divorced    Spouse name: Not on file  . Number of children: Not on file  . Years of education: Not on file  . Highest education level: Not on file  Occupational History  . Occupation: retired  Tobacco Use  . Smoking status: Former Smoker    Packs/day: 1.00    Years: 12.00    Pack years: 12.00    Types: Cigars    Quit date: 2007    Years since quitting: 15.1  . Smokeless tobacco: Never Used  . Tobacco comment: 1 cigar a week  Vaping Use  . Vaping Use: Never used  Substance and Sexual Activity  . Alcohol use: Never  . Drug use: No  . Sexual activity: Not Currently  Other Topics Concern  . Not on file  Social History Narrative  . Not on file   Social Determinants of Health   Financial Resource Strain: Low Risk   . Difficulty of Paying Living Expenses: Not hard at all  Food Insecurity: No Food Insecurity  . Worried About Charity fundraiser in the Last Year: Never true  . Ran Out of Food in the Last Year: Never true  Transportation Needs: No Transportation Needs  . Lack of Transportation (Medical): No  . Lack of Transportation (Non-Medical): No  Physical Activity: Inactive  . Days of  Exercise per Week: 0 days  . Minutes of Exercise per Session: 0 min  Stress: No Stress Concern Present  . Feeling of Stress : Not at all  Social Connections: Not on file    Tobacco Counseling Counseling given: Not Answered Comment: 1 cigar a week   Clinical Intake:  Pre-visit preparation completed: Yes  Pain : No/denies pain     Nutritional Status: BMI > 30  Obese Nutritional Risks: None Diabetes: Yes  How often do you need to have someone help you when you read instructions, pamphlets, or other written materials from your doctor or pharmacy?: 1 - Never What is the last grade level you completed in school?: 8th grade  Diabetic? Yes Nutrition Risk Assessment:  Has the patient had any N/V/D within the last 2 months?  No  Does the patient have any non-healing wounds?  No  Has the patient had any unintentional weight loss or weight gain?  No   Diabetes:  Is the patient diabetic?  Yes  If diabetic, was a CBG obtained today?  No  Did the patient bring in their glucometer from home?  No  How often do you monitor your CBG's? Does not.   Financial Strains and Diabetes Management:  Are you having any financial strains with the device, your supplies or your medication? No .  Does the patient want to be seen by Chronic Care Management for management of their diabetes?  No  Would the patient like to be referred to a Nutritionist or for Diabetic Management?  No   Diabetic Exams:  Diabetic Eye Exam: Overdue for diabetic eye exam. Pt has been advised about the importance in completing this exam. Patient advised to call and schedule an eye exam. Diabetic Foot Exam: Completed 04/04/2020   Interpreter Needed?: No  Information entered by :: NAllen LPN   Activities of Daily Living In your present state of health, do you have any difficulty performing the following activities: 12/13/2020  Hearing? N  Vision? N  Difficulty concentrating or making decisions? N  Walking or climbing  stairs? Y  Dressing or bathing? N  Doing errands, shopping? N  Preparing Food and eating ? N  Using the Toilet? N  In the past six months, have you accidently leaked urine? N  Do you have problems with loss of bowel control? N  Managing your Medications? N  Managing your Finances? N  Housekeeping or managing your Housekeeping? N  Some recent data might be hidden    Patient Care Team: Minette Brine, FNP as PCP - General (General Practice)  Indicate any recent Medical Services you may have received from other than [facility] providers in the past year (date may be approximate).     Assessment:   This is a routine wellness examination for [facility].  Hearing/Vision screen  Hearing Screening   125Hz  250Hz  500Hz  1000Hz  2000Hz  3000Hz  4000Hz  6000Hz  8000Hz   Right ear:           Left ear:           Vision Screening Comments: Regular eye exams, Dr. Clent Jacks  Dietary issues and exercise activities discussed: Current Exercise Habits: The patient does not participate in regular exercise at present  Goals    . Patient Stated     07/14/2019, no goals    . Patient Stated     12/07/2019 no goals    . Patient Stated     12/13/2020, get off diabetes medication      Depression Screen PHQ 2/9 Scores 12/13/2020 01/03/2020 12/07/2019 07/14/2019 02/15/2019 11/17/2018 11/10/2018  PHQ - 2 Score 0 0 0 0 3 0 1  PHQ- 9 Score - - 4 0 12 - 5    Fall Risk Fall Risk  12/13/2020 01/03/2020 12/07/2019 07/14/2019 02/15/2019  Falls in the past year? 0 0 0 0 0  Number falls in past yr: - - - 0 -  Risk for fall due to : Mental status change - Medication side effect Medication side effect -  Follow up Falls evaluation completed;Education provided;Falls prevention discussed - Education provided;Falls prevention discussed;Falls evaluation completed Falls evaluation completed;Education provided;Falls prevention discussed -    FALL RISK PREVENTION PERTAINING TO THE HOME:  Any stairs in or around the home? No  If so, are  there any without handrails? n/a Home free of loose throw rugs in walkways, pet beds, electrical cords, etc? Yes  Adequate lighting in your home to reduce risk of falls? Yes   ASSISTIVE DEVICES UTILIZED TO PREVENT FALLS:  Life alert? No  Use of a cane, walker or w/c? No  Grab bars in the bathroom? Yes  Shower chair or bench in shower? No  Elevated toilet seat or a handicapped toilet? Yes   TIMED UP AND GO:  Was the test performed? No .   Gait steady and fast without use of assistive device  Cognitive Function:     6CIT Screen 12/13/2020 12/07/2019 07/14/2019  What Year? 0 points 0 points 0 points  What month? 0 points 0 points 0 points  What time? 0 points 0 points 0 points  Count back from 20 0 points 0 points 0 points  Months in reverse 0 points 0 points 0 points  Repeat phrase 0 points 0 points 0 points  Total Score 0 0 0    Immunizations Immunization History  Administered Date(s) Administered  . Fluad Quad(high Dose 65+) 08/11/2019  . Influenza, High Dose Seasonal PF 07/14/2019  . Influenza-Unspecified 07/07/2018  . PFIZER(Purple Top)SARS-COV-2 Vaccination 11/26/2019, 12/21/2019, 08/06/2020  . Tdap 01/08/2018    TDAP status: Up to date  Flu Vaccine status: Due, Education has been provided regarding the importance of this vaccine. Advised may receive this vaccine at local pharmacy or Health Dept. Aware to provide a copy of the vaccination record if obtained from local pharmacy or Health Dept. Verbalized acceptance and understanding.  Pneumococcal vaccine status: Up to date  Covid-19 vaccine status: Completed vaccines  Qualifies for Shingles Vaccine? Yes   Zostavax completed No   Shingrix Completed?: Yes  Screening Tests Health Maintenance  Topic Date Due  . INFLUENZA VACCINE  05/13/2020  . OPHTHALMOLOGY EXAM  11/28/2020  . HEMOGLOBIN A1C  01/16/2021  . FOOT EXAM  04/04/2021  . TETANUS/TDAP  01/09/2028  . DEXA SCAN  Completed  . COVID-19 Vaccine  Completed   . Hepatitis C Screening  Completed  . PNA vac Low Risk  Adult  Completed  . HPV VACCINES  Aged Out    Health Maintenance  Health Maintenance Due  Topic Date Due  . INFLUENZA VACCINE  05/13/2020  . OPHTHALMOLOGY EXAM  11/28/2020    Colorectal cancer screening: No longer required.   Mammogram status: Completed 04/12/2020. Repeat every year  Bone Density status: Completed 05/23/2020. Results reflect:   Lung Cancer Screening: (Low Dose CT Chest recommended if Age 70-80 years, 30 pack-year currently smoking OR have quit w/in 15years.) does not qualify.   Lung Cancer Screening Referral: no  Additional Screening:  Hepatitis C Screening: does qualify; Completed 06/24/2017  Vision Screening: Recommended annual ophthalmology exams for early detection of glaucoma and other disorders of the eye. Is the patient up to date with their annual eye exam?  No  Who is the provider or what is the name of the office in which the patient attends annual eye exams? Dr. Katy Fitch If pt is not established with a provider, would they like to be referred to a provider to establish care? No .   Dental Screening: Recommended annual dental exams for proper oral hygiene  Community Resource Referral / Chronic Care Management: CRR required this visit?  No   CCM required this visit?  No      Plan:     I have personally reviewed and noted the following in the patient's chart:   . Medical and social history . Use of alcohol, tobacco or illicit drugs  . Current medications and supplements . Functional ability and status . Nutritional status . Physical activity . Advanced directives . List of other physicians . Hospitalizations, surgeries, and ER visits in previous 12 months . Vitals . Screenings to include cognitive, depression, and falls . Referrals and appointments  In addition, I have reviewed and discussed with patient certain preventive protocols, quality metrics, and best practice recommendations.  A written personalized care plan for preventive services as well as general preventive health recommendations were provided to patient.     Kellie Simmering, LPN   04/14/2201   Nurse Notes:

## 2020-12-13 NOTE — Progress Notes (Signed)
I,Yamilka Roman Lebron,acting as a scribe for Janece Moore, FNP.,have documented all relevant documentation on the behalf of Janece Moore, FNP,as directed by  Janece Moore, FNP while in the presence of Janece Moore, FNP.  This visit occurred during the SARS-CoV-2 public health emergency.  Safety protocols were in place, including screening questions prior to the visit, additional usage of staff PPE, and extensive cleaning of exam room while observing appropriate contact time as indicated for disinfecting solutions.  Subjective:     Patient ID: Kristen Cannon , female    DOB: 01/28/1946 , 75 y.o.   MRN: [id number]   Chief Complaint  Patient presents with  . Hypertension  . Diabetes    HPI  Patient presents today for a blood pressure and dm f/u  Wt Readings from Last 3 Encounters: 12/13/20 : 179 lb 3.7 oz (81.3 kg) 12/13/20 : 179 lb 3.2 oz (81.3 kg) 07/18/20 : 174 lb 9.6 oz (79.2 kg)  Hypertension This is a chronic problem. The current episode started more than 1 year ago. The problem is controlled. Pertinent negatives include no blurred vision or headaches.  Diabetes She presents for her follow-up diabetic visit. She has type 2 diabetes mellitus. There are no hypoglycemic associated symptoms. Pertinent negatives for hypoglycemia include no dizziness or headaches. There are no diabetic associated symptoms. Pertinent negatives for diabetes include no blurred vision and no fatigue. There are no hypoglycemic complications. There are no diabetic complications. Risk factors for coronary artery disease include obesity and sedentary lifestyle. Current diabetic treatment includes oral agent (dual therapy). She is compliant with treatment all of the time. She does not see a podiatrist.Eye exam is not current.     Past Medical History:  Diagnosis Date  . Diabetes mellitus   . High cholesterol   . Hypertension      Family History  Problem Relation Age of Onset  . Breast cancer  Sister   . Breast cancer Daughter   . Early death Mother   . Early death Father      Current Outpatient Medications:  .  aspirin EC 81 MG tablet, Take 81 mg by mouth every morning., Disp: , Rfl:  .  atorvastatin (LIPITOR) 20 MG tablet, Take 1 tablet by mouth daily at bedtime, Disp: 90 tablet, Rfl: 1 .  diclofenac Sodium (VOLTAREN) 1 % GEL, Apply 2 g topically 4 (four) times daily. (Patient not taking: Reported on 12/13/2020), Disp: 100 g, Rfl: 3 .  glipiZIDE (GLUCOTROL) 10 MG tablet, Take one tab by mouth twice a day, Disp: 180 tablet, Rfl: 1 .  linaclotide (LINZESS) 72 MCG capsule, Take 1 capsule (72 mcg total) by mouth daily before breakfast. As needed, Disp: 90 capsule, Rfl: 1 .  losartan-hydrochlorothiazide (HYZAAR) 100-12.5 MG tablet, Take 1 tablet by mouth daily., Disp: 90 tablet, Rfl: 1 .  TRADJENTA 5 MG TABS tablet, TAKE 1 TABLET BY MOUTH DAILY, Disp: 90 tablet, Rfl: 0 .  Vitamin D, Ergocalciferol, (DRISDOL) 1.25 MG (50000 UNIT) CAPS capsule, TAKE 1 CAPSULE BY MOUTH 1 TIME A WEEK, Disp: 12 capsule, Rfl: 1   No Known Allergies   Review of Systems  Constitutional: Negative for fatigue.  Eyes: Negative for blurred vision.  Neurological: Negative for dizziness and headaches.     Today's Vitals   12/13/20 1015  BP: 124/78  Pulse: 87  Temp: 98.2 F (36.8 C)  TempSrc: Oral  Weight: 179 lb 3.7 oz (81.3 kg)  Height: 4' 11" (1.499 m)   Body mass index   is 36.2 kg/m.   Objective:  Physical Exam Constitutional:      General: She is not in acute distress.    Appearance: Normal appearance. She is obese.  Cardiovascular:     Rate and Rhythm: Normal rate and regular rhythm.     Pulses: Normal pulses.     Heart sounds: Normal heart sounds.  Pulmonary:     Effort: Pulmonary effort is normal.     Breath sounds: Normal breath sounds.  Abdominal:     General: Abdomen is flat. Bowel sounds are normal.     Palpations: Abdomen is soft.  Musculoskeletal:     Cervical back: Normal  range of motion and neck supple.  Skin:    General: Skin is warm and dry.     Capillary Refill: Capillary refill takes less than 2 seconds.  Neurological:     General: No focal deficit present.     Mental Status: She is alert and oriented to person, place, and time.  Psychiatric:        Mood and Affect: Mood normal.        Behavior: Behavior normal.        Thought Content: Thought content normal.        Judgment: Judgment normal.         Assessment And Plan:     1. Diabetes mellitus with stage 3 chronic kidney disease (HCC)  Chronic,   Continue with current medications  Encouraged to increase physical activity as tolerated of at least 150 minutes a week - Hemoglobin A1c  2. Hypertensive nephropathy . B/P is controlled.  . CMP ordered to check renal function.  . The importance of regular exercise and dietary modification was stressed to the patient.  - BMP8+eGFR  3. Constipation, unspecified constipation type  Chronic, continue with linzess and drink adequate amounts of water - linaclotide (LINZESS) 72 MCG capsule; Take 1 capsule (72 mcg total) by mouth daily before breakfast. As needed  Dispense: 90 capsule; Refill: 1  4. Pure hypercholesterolemia  Chronic, stable  Continue with current medications, tolerating well. - Lipid panel     Patient was given opportunity to ask questions. Patient verbalized understanding of the plan and was able to repeat key elements of the plan. All questions were answered to their satisfaction.  Minette Brine, FNP   I, Minette Brine, FNP, have reviewed all documentation for this visit. The documentation on 12/13/20 for the exam, diagnosis, procedures, and orders are all accurate and complete.   THE PATIENT IS ENCOURAGED TO PRACTICE SOCIAL DISTANCING DUE TO THE COVID-19 PANDEMIC.

## 2020-12-14 LAB — LIPID PANEL
Chol/HDL Ratio: 2.3 ratio (ref 0.0–4.4)
Cholesterol, Total: 135 mg/dL (ref 100–199)
HDL: 60 mg/dL (ref >39)
LDL Chol Calc (NIH): 59 mg/dL (ref 0–99)
Triglycerides: 80 mg/dL (ref 0–149)
VLDL Cholesterol Cal: 16 mg/dL (ref 5–40)

## 2020-12-14 LAB — BMP8+EGFR
BUN/Creatinine Ratio: 16 (ref 12–28)
BUN: 19 mg/dL (ref 8–27)
CO2: 23 mmol/L (ref 20–29)
Calcium: 10.5 mg/dL — ABNORMAL HIGH (ref 8.7–10.3)
Chloride: 102 mmol/L (ref 96–106)
Creatinine, Ser: 1.19 mg/dL — ABNORMAL HIGH (ref 0.57–1.00)
Glucose: 58 mg/dL — ABNORMAL LOW (ref 65–99)
Potassium: 3.8 mmol/L (ref 3.5–5.2)
Sodium: 142 mmol/L (ref 134–144)
eGFR: 47 mL/min/{1.73_m2} — ABNORMAL LOW (ref >59)

## 2020-12-14 LAB — HEMOGLOBIN A1C
Est. average glucose Bld gHb Est-mCnc: 163 mg/dL
Hgb A1c MFr Bld: 7.3 % — ABNORMAL HIGH (ref 4.8–5.6)

## 2020-12-31 ENCOUNTER — Other Ambulatory Visit: Payer: Self-pay | Admitting: Nurse Practitioner

## 2021-01-01 DIAGNOSIS — E559 Vitamin D deficiency, unspecified: Secondary | ICD-10-CM | POA: Diagnosis not present

## 2021-01-01 DIAGNOSIS — N1831 Chronic kidney disease, stage 3a: Secondary | ICD-10-CM | POA: Diagnosis not present

## 2021-01-01 DIAGNOSIS — E1122 Type 2 diabetes mellitus with diabetic chronic kidney disease: Secondary | ICD-10-CM | POA: Diagnosis not present

## 2021-01-01 DIAGNOSIS — I129 Hypertensive chronic kidney disease with stage 1 through stage 4 chronic kidney disease, or unspecified chronic kidney disease: Secondary | ICD-10-CM | POA: Diagnosis not present

## 2021-01-01 DIAGNOSIS — E785 Hyperlipidemia, unspecified: Secondary | ICD-10-CM | POA: Diagnosis not present

## 2021-02-11 ENCOUNTER — Other Ambulatory Visit: Payer: Self-pay | Admitting: Nurse Practitioner

## 2021-03-26 DIAGNOSIS — H40013 Open angle with borderline findings, low risk, bilateral: Secondary | ICD-10-CM | POA: Diagnosis not present

## 2021-04-22 ENCOUNTER — Other Ambulatory Visit: Payer: Self-pay

## 2021-04-22 ENCOUNTER — Ambulatory Visit (INDEPENDENT_AMBULATORY_CARE_PROVIDER_SITE_OTHER): Payer: Medicare Other | Admitting: Nurse Practitioner

## 2021-04-22 ENCOUNTER — Encounter: Payer: Self-pay | Admitting: Nurse Practitioner

## 2021-04-22 VITALS — BP 120/74 | HR 84 | Temp 98.8°F | Ht 59.0 in | Wt 177.0 lb

## 2021-04-22 DIAGNOSIS — N183 Chronic kidney disease, stage 3 unspecified: Secondary | ICD-10-CM | POA: Diagnosis not present

## 2021-04-22 DIAGNOSIS — E7800 Pure hypercholesterolemia, unspecified: Secondary | ICD-10-CM

## 2021-04-22 DIAGNOSIS — Z6835 Body mass index (BMI) 35.0-35.9, adult: Secondary | ICD-10-CM | POA: Diagnosis not present

## 2021-04-22 DIAGNOSIS — I129 Hypertensive chronic kidney disease with stage 1 through stage 4 chronic kidney disease, or unspecified chronic kidney disease: Secondary | ICD-10-CM

## 2021-04-22 DIAGNOSIS — E559 Vitamin D deficiency, unspecified: Secondary | ICD-10-CM | POA: Diagnosis not present

## 2021-04-22 DIAGNOSIS — E1122 Type 2 diabetes mellitus with diabetic chronic kidney disease: Secondary | ICD-10-CM | POA: Diagnosis not present

## 2021-04-22 MED ORDER — OZEMPIC (0.25 OR 0.5 MG/DOSE) 2 MG/1.5ML ~~LOC~~ SOPN: 0.5000 mg | PEN_INJECTOR | SUBCUTANEOUS | 1 refills | Status: DC

## 2021-04-22 NOTE — Patient Instructions (Addendum)
Hypertension, Adult Hypertension is another name for high blood pressure. High blood pressure forces your heart to work harder to pump blood. This can cause problems overtime. There are two numbers in a blood pressure reading. There is a top number (systolic) over a bottom number (diastolic). It is best to have a blood pressure that is below 120/80. Healthy choicescan help lower your blood pressure, or you may need medicine to help lower it. What are the causes? The cause of this condition is not known. Some conditions may be related tohigh blood pressure. What increases the risk? Smoking. Having type 2 diabetes mellitus, high cholesterol, or both. Not getting enough exercise or physical activity. Being overweight. Having too much fat, sugar, calories, or salt (sodium) in your diet. Drinking too much alcohol. Having long-term (chronic) kidney disease. Having a family history of high blood pressure. Age. Risk increases with age. Race. You may be at higher risk if you are African American. Gender. Men are at higher risk than women before age 20. After age 72, women are at higher risk than men. Having obstructive sleep apnea. Stress. What are the signs or symptoms? High blood pressure may not cause symptoms. Very high blood pressure (hypertensive crisis) may cause: Headache. Feelings of worry or nervousness (anxiety). Shortness of breath. Nosebleed. A feeling of being sick to your stomach (nausea). Throwing up (vomiting). Changes in how you see. Very bad chest pain. Seizures. How is this treated? This condition is treated by making healthy lifestyle changes, such as: Eating healthy foods. Exercising more. Drinking less alcohol. Your health care provider may prescribe medicine if lifestyle changes are not enough to get your blood pressure under control, and if: Your top number is above 130. Your bottom number is above 80. Your personal target blood pressure may vary. Follow these  instructions at home: Eating and drinking  If told, follow the DASH eating plan. To follow this plan: Fill one half of your plate at each meal with fruits and vegetables. Fill one fourth of your plate at each meal with whole grains. Whole grains include whole-wheat pasta, brown rice, and whole-grain bread. Eat or drink low-fat dairy products, such as skim milk or low-fat yogurt. Fill one fourth of your plate at each meal with low-fat (lean) proteins. Low-fat proteins include fish, chicken without skin, eggs, beans, and tofu. Avoid fatty meat, cured and processed meat, or chicken with skin. Avoid pre-made or processed food. Eat less than 1,500 mg of salt each day. Do not drink alcohol if: Your doctor tells you not to drink. You are pregnant, may be pregnant, or are planning to become pregnant. If you drink alcohol: Limit how much you use to: 0-1 drink a day for women. 0-2 drinks a day for men. Be aware of how much alcohol is in your drink. In the [place] one drink equals one 12 oz bottle of beer (355 mL), one 5 oz glass of wine (148 mL), or one 1 oz glass of hard liquor (44 mL).  Lifestyle  Work with your doctor to stay at a healthy weight or to lose weight. Ask your doctor what the best weight is for you. Get at least 30 minutes of exercise most days of the week. This may include walking, swimming, or biking. Get at least 30 minutes of exercise that strengthens your muscles (resistance exercise) at least 3 days a week. This may include lifting weights or doing Pilates. Do not use any products that contain nicotine or tobacco, such as cigarettes,  e-cigarettes, and chewing tobacco. If you need help quitting, ask your doctor. Check your blood pressure at home as told by your doctor. Keep all follow-up visits as told by your doctor. This is important.  Medicines Take over-the-counter and prescription medicines only as told by your doctor. Follow directions carefully. Do not skip doses of  blood pressure medicine. The medicine does not work as well if you skip doses. Skipping doses also puts you at risk for problems. Ask your doctor about side effects or reactions to medicines that you should watch for. Contact a doctor if you: Think you are having a reaction to the medicine you are taking. Have headaches that keep coming back (recurring). Feel dizzy. Have swelling in your ankles. Have trouble with your vision. Get help right away if you: Get a very bad headache. Start to feel mixed up (confused). Feel weak or numb. Feel faint. Have very bad pain in your: Chest. Belly (abdomen). Throw up more than once. Have trouble breathing. Summary Hypertension is another name for high blood pressure. High blood pressure forces your heart to work harder to pump blood. For most people, a normal blood pressure is less than 120/80. Making healthy choices can help lower blood pressure. If your blood pressure does not get lower with healthy choices, you may need to take medicine. This information is not intended to replace advice given to you by your health care provider. Make sure you discuss any questions you have with your healthcare provider. Document Revised: 06/09/2018 Document Reviewed: 06/09/2018 Elsevier Patient Education  [place].  Will start Ozempic 0.25 mg for 4 weeks then increase to 0.5 mg for 2 weeks the initial pen will last for 6 weeks.

## 2021-04-22 NOTE — Progress Notes (Signed)
I,Yamilka Roman [employer] as a Education administrator for [employer], FNP.,have documented all relevant documentation on the behalf of Minette Brine, FNP,as directed by  Minette Brine, FNP while in the presence of Minette Brine, [place].  This visit occurred during the SARS-CoV-2 public health emergency.  Safety protocols were in place, including screening questions prior to the visit, additional usage of staff PPE, and extensive cleaning of exam room while observing appropriate contact time as indicated for disinfecting solutions.  Subjective:     Patient ID: Kristen Cannon , female    DOB: 1945-12-18 , 75 y.o.   MRN: [id number]   Chief Complaint  Patient presents with   Diabetes   Hypertension    HPI  Patient presents today for a blood pressure and dm f/u  Wt Readings from Last 3 Encounters: 04/22/21 : 177 lb (80.3 kg) 12/13/20 : 179 lb 3.7 oz (81.3 kg) 12/13/20 : 179 lb 3.2 oz (81.3 kg)    Diabetes She presents for her follow-up diabetic visit. She has type 2 diabetes mellitus. There are no hypoglycemic associated symptoms. Pertinent negatives for hypoglycemia include no dizziness or headaches. There are no diabetic associated symptoms. Pertinent negatives for diabetes include no blurred vision, no chest pain and no fatigue. There are no hypoglycemic complications. There are no diabetic complications. Risk factors for coronary artery disease include obesity and sedentary lifestyle. Current diabetic treatment includes oral agent (dual therapy). She is compliant with treatment all of the time. She does not see a podiatrist.Eye exam is not current.  Hypertension This is a chronic problem. The current episode started more than 1 year ago. The problem is controlled. Pertinent negatives include no blurred vision, chest pain, headaches, palpitations or shortness of breath.    Past Medical History:  Diagnosis Date   Diabetes mellitus    High cholesterol    Hypertension      Family History   Problem Relation Age of Onset   Breast cancer Sister    Breast cancer Daughter    Early death Mother    Early death Father      Current Outpatient Medications:    aspirin EC 81 MG tablet, Take 81 mg by mouth every morning., Disp: , Rfl:    atorvastatin (LIPITOR) 20 MG tablet, TAKE 1 TABLET(20 MG) BY MOUTH DAILY, Disp: 90 tablet, Rfl: 1   glipiZIDE (GLUCOTROL) 10 MG tablet, Take one tab by mouth twice a day, Disp: 180 tablet, Rfl: 1   linaclotide (LINZESS) 72 MCG capsule, Take 1 capsule (72 mcg total) by mouth daily before breakfast. As needed, Disp: 90 capsule, Rfl: 1   losartan-hydrochlorothiazide (HYZAAR) 100-12.5 MG tablet, Take 1 tablet by mouth daily., Disp: 90 tablet, Rfl: 1   Semaglutide,0.25 or 0.5MG /DOS, (OZEMPIC, 0.25 OR 0.5 MG/DOSE,) 2 MG/1.5ML SOPN, Inject 0.5 mg into the skin once a week., Disp: 4.5 mL, Rfl: 1   Vitamin D, Ergocalciferol, (DRISDOL) 1.25 MG (50000 UNIT) CAPS capsule, TAKE 1 CAPSULE BY MOUTH 1 TIME A WEEK, Disp: 12 capsule, Rfl: 1   diclofenac Sodium (VOLTAREN) 1 % GEL, Apply 2 g topically 4 (four) times daily. (Patient not taking: No sig reported), Disp: 100 g, Rfl: 3   No Known Allergies   Review of Systems  Constitutional: Negative.  Negative for fatigue.  Eyes:  Negative for blurred vision.  Respiratory: Negative.  Negative for cough, shortness of breath and wheezing.   Cardiovascular: Negative.  Negative for chest pain, palpitations and leg swelling.  Neurological:  Negative for dizziness and  headaches.  Psychiatric/Behavioral: Negative.      Today's Vitals   04/22/21 1033  BP: 120/74  Pulse: 84  Temp: 98.8 F (37.1 C)  Weight: 177 lb (80.3 kg)  Height: 4\' 11"  (1.499 m)  PainSc: 0-No pain   Body mass index is 35.75 kg/m.   Objective:  Physical Exam Vitals reviewed.  Constitutional:      General: She is not in acute distress.    Appearance: Normal appearance. She is obese.  Cardiovascular:     Rate and Rhythm: Normal rate and regular  rhythm.     Pulses: Normal pulses.     Heart sounds: Normal heart sounds. No murmur heard. Pulmonary:     Effort: Pulmonary effort is normal. No respiratory distress.     Breath sounds: Normal breath sounds. No wheezing.  Abdominal:     General: Abdomen is flat. Bowel sounds are normal.     Palpations: Abdomen is soft.  Musculoskeletal:     Cervical back: Normal range of motion and neck supple.  Skin:    General: Skin is warm and dry.     Capillary Refill: Capillary refill takes less than 2 seconds.  Neurological:     General: No focal deficit present.     Mental Status: She is alert and oriented to person, place, and time.     Cranial Nerves: No cranial nerve deficit.     Motor: No weakness.  Psychiatric:        Mood and Affect: Mood normal.        Behavior: Behavior normal.        Thought Content: Thought content normal.        Judgment: Judgment normal.        Assessment And Plan:     1. Diabetes mellitus with stage 3 chronic kidney disease (HCC) Will discontinue tradjenta and start ozempic this may help with weight loss as well  - Semaglutide,0.25 or 0.5MG /DOS, (OZEMPIC, 0.25 OR 0.5 MG/DOSE,) 2 MG/1.5ML SOPN; Inject 0.5 mg into the skin once a week.  Dispense: 4.5 mL; Refill: 1  2. Hypertensive nephropathy Chronic, excellent control  3. Body mass index (BMI) of 35.0 to 35.9 Chronic Discussed healthy diet and regular exercise options  Encouraged to exercise at least 150 minutes per week with 2 days of strength training  4. Vitamin D deficiency Will check vitamin D level and supplement as needed.    Also encouraged to spend 15 minutes in the sun daily.  - TSH - VITAMIN D 25 Hydroxy (Vit-D Deficiency, Fractures)  5. Pure hypercholesterolemia Chronic, continue atorvastatin Tolerating medications well  6. Hypercalcemia - TSH   Patient was given opportunity to ask questions. Patient verbalized understanding of the plan and was able to repeat key elements of the  plan. All questions were answered to their satisfaction.  Minette Brine, FNP   I, Minette Brine, FNP, have reviewed all documentation for this visit. The documentation on 04/22/21 for the exam, diagnosis, procedures, and orders are all accurate and complete.   IF YOU HAVE BEEN REFERRED TO A SPECIALIST, IT MAY TAKE 1-2 WEEKS TO SCHEDULE/PROCESS THE REFERRAL. IF YOU HAVE NOT HEARD FROM US/SPECIALIST IN TWO WEEKS, PLEASE GIVE [place] A CALL AT [phone].   THE PATIENT IS ENCOURAGED TO PRACTICE SOCIAL DISTANCING DUE TO THE COVID-19 PANDEMIC.

## 2021-04-23 LAB — VITAMIN D 25 HYDROXY (VIT D DEFICIENCY, FRACTURES): Vit D, 25-Hydroxy: 78 ng/mL (ref 30.0–100.0)

## 2021-04-23 LAB — TSH: TSH: 3.37 u[IU]/mL (ref 0.450–4.500)

## 2021-06-20 ENCOUNTER — Other Ambulatory Visit: Payer: Self-pay | Admitting: Nurse Practitioner

## 2021-07-15 ENCOUNTER — Other Ambulatory Visit: Payer: Self-pay | Admitting: Nurse Practitioner

## 2021-07-22 ENCOUNTER — Other Ambulatory Visit: Payer: Self-pay

## 2021-07-22 ENCOUNTER — Encounter: Payer: Self-pay | Admitting: Nurse Practitioner

## 2021-07-22 ENCOUNTER — Ambulatory Visit (INDEPENDENT_AMBULATORY_CARE_PROVIDER_SITE_OTHER): Payer: Medicare Other | Admitting: Nurse Practitioner

## 2021-07-22 VITALS — BP 130/80 | Temp 98.5°F | Ht 59.0 in | Wt 174.2 lb

## 2021-07-22 DIAGNOSIS — Z0000 Encounter for general adult medical examination without abnormal findings: Secondary | ICD-10-CM | POA: Diagnosis not present

## 2021-07-22 DIAGNOSIS — E7800 Pure hypercholesterolemia, unspecified: Secondary | ICD-10-CM | POA: Diagnosis not present

## 2021-07-22 DIAGNOSIS — N183 Chronic kidney disease, stage 3 unspecified: Secondary | ICD-10-CM | POA: Diagnosis not present

## 2021-07-22 DIAGNOSIS — E559 Vitamin D deficiency, unspecified: Secondary | ICD-10-CM | POA: Diagnosis not present

## 2021-07-22 DIAGNOSIS — E1122 Type 2 diabetes mellitus with diabetic chronic kidney disease: Secondary | ICD-10-CM | POA: Diagnosis not present

## 2021-07-22 DIAGNOSIS — I129 Hypertensive chronic kidney disease with stage 1 through stage 4 chronic kidney disease, or unspecified chronic kidney disease: Secondary | ICD-10-CM | POA: Diagnosis not present

## 2021-07-22 DIAGNOSIS — Z23 Encounter for immunization: Secondary | ICD-10-CM

## 2021-07-22 DIAGNOSIS — Z6835 Body mass index (BMI) 35.0-35.9, adult: Secondary | ICD-10-CM

## 2021-07-22 LAB — POCT URINALYSIS DIPSTICK
Bilirubin, UA: NEGATIVE
Blood, UA: NEGATIVE
Glucose, UA: NEGATIVE
Ketones, UA: NEGATIVE
Leukocytes, UA: NEGATIVE
Nitrite, UA: NEGATIVE
Protein, UA: NEGATIVE
Spec Grav, UA: >=1.03 — AB (ref 1.010–1.025)
Urobilinogen, UA: 0.2 E.U./dL
pH, UA: 5.5 (ref 5.0–8.0)

## 2021-07-22 LAB — POCT UA - MICROALBUMIN
Albumin/Creatinine Ratio, Urine, POC: <30
Creatinine, POC: 300 mg/dL
Microalbumin Ur, POC: 30 mg/L

## 2021-07-22 MED ORDER — LOSARTAN POTASSIUM-HCTZ 100-12.5 MG PO TABS: 1.0000 | ORAL_TABLET | Freq: Every day | ORAL | 1 refills | Status: DC

## 2021-07-22 MED ORDER — ATORVASTATIN CALCIUM 20 MG PO TABS: ORAL_TABLET | ORAL | 1 refills | Status: DC

## 2021-07-22 MED ORDER — RYBELSUS 7 MG PO TABS: 1.0000 | ORAL_TABLET | Freq: Every day | ORAL | 0 refills | Status: DC

## 2021-07-22 NOTE — Patient Instructions (Addendum)
Health Maintenance, Female Adopting a healthy lifestyle and getting preventive care are important in promoting health and wellness. Ask your health care provider about: The right schedule for you to have regular tests and exams. Things you can do on your own to prevent diseases and keep yourself healthy. What should I know about diet, weight, and exercise? Eat a healthy diet  Eat a diet that includes plenty of vegetables, fruits, low-fat dairy products, and lean protein. Do not eat a lot of foods that are high in solid fats, added sugars, or sodium. Maintain a healthy weight Body mass index (BMI) is used to identify weight problems. It estimates body fat based on height and weight. Your health care provider can help determine your BMI and help you achieve or maintain a healthy weight. Get regular exercise Get regular exercise. This is one of the most important things you can do for your health. Most adults should: Exercise for at least 150 minutes each week. The exercise should increase your heart rate and make you sweat (moderate-intensity exercise). Do strengthening exercises at least twice a week. This is in addition to the moderate-intensity exercise. Spend less time sitting. Even light physical activity can be beneficial. Watch cholesterol and blood lipids Have your blood tested for lipids and cholesterol at 77 years of age, then have this test every 5 years. Have your cholesterol levels checked more often if: Your lipid or cholesterol levels are high. You are older than 77 years of age. You are at high risk for heart disease. What should I know about cancer screening? Depending on your health history and family history, you may need to have cancer screening at various ages. This may include screening for: Breast cancer. Cervical cancer. Colorectal cancer. Skin cancer. Lung cancer. What should I know about heart disease, diabetes, and high blood pressure? Blood pressure and heart  disease High blood pressure causes heart disease and increases the risk of stroke. This is more likely to develop in people who have high blood pressure readings, are of African descent, or are overweight. Have your blood pressure checked: Every 3-5 years if you are 18-39 years of age. Every year if you are 40 years old or older. Diabetes Have regular diabetes screenings. This checks your fasting blood sugar level. Have the screening done: Once every three years after age 40 if you are at a normal weight and have a low risk for diabetes. More often and at a younger age if you are overweight or have a high risk for diabetes. What should I know about preventing infection? Hepatitis B If you have a higher risk for hepatitis B, you should be screened for this virus. Talk with your health care provider to find out if you are at risk for hepatitis B infection. Hepatitis C Testing is recommended for: Everyone born from 1945 through 1965. Anyone with known risk factors for hepatitis C. Sexually transmitted infections (STIs) Get screened for STIs, including gonorrhea and chlamydia, if: You are sexually active and are younger than 77 years of age. You are older than 77 years of age and your health care provider tells you that you are at risk for this type of infection. Your sexual activity has changed since you were last screened, and you are at increased risk for chlamydia or gonorrhea. Ask your health care provider if you are at risk. Ask your health care provider about whether you are at high risk for HIV. Your health care provider may recommend a prescription medicine   to help prevent HIV infection. If you choose to take medicine to prevent HIV, you should first get tested for HIV. You should then be tested every 3 months for as long as you are taking the medicine. Pregnancy If you are about to stop having your period (premenopausal) and you may become pregnant, seek counseling before you get  pregnant. Take 400 to 800 micrograms (mcg) of folic acid every day if you become pregnant. Ask for birth control (contraception) if you want to prevent pregnancy. Osteoporosis and menopause Osteoporosis is a disease in which the bones lose minerals and strength with aging. This can result in bone fractures. If you are 56 years old or older, or if you are at risk for osteoporosis and fractures, ask your health care provider if you should: Be screened for bone loss. Take a calcium or vitamin D supplement to lower your risk of fractures. Be given hormone replacement therapy (HRT) to treat symptoms of menopause. Follow these instructions at home: Lifestyle Do not use any products that contain nicotine or tobacco, such as cigarettes, e-cigarettes, and chewing tobacco. If you need help quitting, ask your health care provider. Do not use street drugs. Do not share needles. Ask your health care provider for help if you need support or information about quitting drugs. Alcohol use Do not drink alcohol if: Your health care provider tells you not to drink. You are pregnant, may be pregnant, or are planning to become pregnant. If you drink alcohol: Limit how much you use to 0-1 drink a day. Limit intake if you are breastfeeding. Be aware of how much alcohol is in your drink. In the [place] one drink equals one 12 oz bottle of beer (355 mL), one 5 oz glass of wine (148 mL), or one 1 oz glass of hard liquor (44 mL). General instructions Schedule regular health, dental, and eye exams. Stay current with your vaccines. Tell your health care provider if: You often feel depressed. You have ever been abused or do not feel safe at home. Summary Adopting a healthy lifestyle and getting preventive care are important in promoting health and wellness. Follow your health care provider's instructions about healthy diet, exercising, and getting tested or screened for diseases. Follow your health care provider's  instructions on monitoring your cholesterol and blood pressure. This information is not intended to replace advice given to you by your health care provider. Make sure you discuss any questions you have with your health care provider. Document Revised: 12/07/2020 Document Reviewed: 09/22/2018 Elsevier Patient Education  2022 [place] will stop taking the [place]

## 2021-07-22 NOTE — Progress Notes (Addendum)
I,Yamilka J Llittleton,acting as a Education administrator for [employer], FNP.,have documented all relevant documentation on the behalf of Minette Brine, FNP,as directed by  Minette Brine, FNP while in the presence of Minette Brine, [place].   This visit occurred during the SARS-CoV-2 public health emergency.  Safety protocols were in place, including screening questions prior to the visit, additional usage of staff PPE, and extensive cleaning of exam room while observing appropriate contact time as indicated for disinfecting solutions.  Subjective:     Patient ID: Kristen Cannon , female    DOB: 14-Nov-1945 , 75 y.o.   MRN: [id number]   Chief Complaint  Patient presents with   Annual Exam    HPI  Patient here for hm. She is no longer goes to a GYN provider.     Past Medical History:  Diagnosis Date   Diabetes mellitus    High cholesterol    Hypertension      Family History  Problem Relation Age of Onset   Breast cancer Sister    Breast cancer Daughter    Early death Mother    Early death Father      Current Outpatient Medications:    aspirin EC 81 MG tablet, Take 81 mg by mouth every morning., Disp: , Rfl:    glipiZIDE (GLUCOTROL) 10 MG tablet, TAKE 1 TABLET BY MOUTH TWICE DAILY, Disp: 180 tablet, Rfl: 1   linaclotide (LINZESS) 72 MCG capsule, Take 1 capsule (72 mcg total) by mouth daily before breakfast. As needed, Disp: 90 capsule, Rfl: 1   Semaglutide (RYBELSUS) 7 MG TABS, Take 1 tablet by mouth daily. Take 30 minutes before breakfast, Disp: 90 tablet, Rfl: 0   Vitamin D, Ergocalciferol, (DRISDOL) 1.25 MG (50000 UNIT) CAPS capsule, TAKE 1 CAPSULE BY MOUTH 1 TIME A WEEK, Disp: 12 capsule, Rfl: 1   atorvastatin (LIPITOR) 20 MG tablet, TAKE 1 TABLET(20 MG) BY MOUTH DAILY, Disp: 90 tablet, Rfl: 1   losartan-hydrochlorothiazide (HYZAAR) 100-12.5 MG tablet, Take 1 tablet by mouth daily., Disp: 90 tablet, Rfl: 1   No Known Allergies    The patient states she is status post  hysterectomy   Negative for: breast discharge, breast lump(s), breast pain and breast self exam. Associated symptoms include abnormal vaginal bleeding. Pertinent negatives include abnormal bleeding (hematology), anxiety, decreased libido, depression, difficulty falling sleep, dyspareunia, history of infertility, nocturia, sexual dysfunction, sleep disturbances, urinary incontinence, urinary urgency, vaginal discharge and vaginal itching. Diet regular; she eats less, states "hard to give up my bread".  The patient states her exercise level is minimal, she will walk in the hallway of the high rise 2-3 times a day.    The patient's tobacco use is:  Social History   Tobacco Use  Smoking Status Former   Packs/day: 1.00   Years: 12.00   Pack years: 12.00   Types: Cigars, Cigarettes   Quit date: 2007   Years since quitting: 15.7  Smokeless Tobacco Never  Tobacco Comments   1 cigar a week   She has been exposed to passive smoke. The patient's alcohol use is:  Social History   Substance and Sexual Activity  Alcohol Use Never   Review of Systems  Constitutional: Negative.   HENT:  Positive for postnasal drip.   Eyes: Negative.   Respiratory: Negative.    Cardiovascular: Negative.   Gastrointestinal: Negative.   Endocrine: Negative.   Genitourinary: Negative.   Musculoskeletal: Negative.   Skin: Negative.   Allergic/Immunologic: Negative.   Neurological: Negative.  Hematological: Negative.   Psychiatric/Behavioral: Negative.      Today's Vitals   07/22/21 1047  BP: 130/80  Temp: 98.5 F (36.9 C)  Weight: 174 lb 3.2 oz (79 kg)  Height: _0  (1.499 m)  PainSc: 0-No pain   Body mass index is 35.18 kg/m.  Wt Readings from Last 3 Encounters:  07/22/21 174 lb 3.2 oz (79 kg)  04/22/21 177 lb (80.3 kg)  12/13/20 179 lb 3.7 oz (81.3 kg)     Objective:  Physical Exam Constitutional:      General: She is not in acute distress.    Appearance: Normal appearance. She is  well-developed. She is obese.  HENT:     Head: Normocephalic and atraumatic.     Right Ear: Hearing, tympanic membrane, ear canal and external ear normal. There is no impacted cerumen.     Left Ear: Hearing, tympanic membrane, ear canal and external ear normal. There is no impacted cerumen.     Nose:     Comments: Deferred - masked    Mouth/Throat:     Comments: Deferred - masked  Eyes:     General: Lids are normal.     Extraocular Movements: Extraocular movements intact.     Conjunctiva/sclera: Conjunctivae normal.     Pupils: Pupils are equal, round, and reactive to light.     Funduscopic exam:    Right eye: No papilledema.        Left eye: No papilledema.  Neck:     Thyroid: No thyroid mass.     Vascular: No carotid bruit.  Cardiovascular:     Rate and Rhythm: Normal rate and regular rhythm.     Pulses: Normal pulses.     Heart sounds: Normal heart sounds. No murmur heard. Pulmonary:     Effort: Pulmonary effort is normal.     Breath sounds: Normal breath sounds.  Chest:     Chest wall: No mass.  Breasts:    Tanner Score is 5.     Breasts are symmetrical.     Right: Normal. No tenderness.     Left: No tenderness.  Abdominal:     General: Abdomen is flat. Bowel sounds are normal. There is no distension.     Palpations: Abdomen is soft.     Tenderness: There is no abdominal tenderness.  Genitourinary:    Rectum: Guaiac result negative.  Musculoskeletal:        General: No swelling. Normal range of motion.     Cervical back: Full passive range of motion without pain, normal range of motion and neck supple.     Right lower leg: No edema.     Left lower leg: No edema.  Lymphadenopathy:     Upper Body:     Right upper body: No supraclavicular or axillary adenopathy.     Left upper body: No supraclavicular or axillary adenopathy.  Skin:    General: Skin is warm and dry.     Capillary Refill: Capillary refill takes less than 2 seconds.  Neurological:     General: No  focal deficit present.     Mental Status: She is alert and oriented to person, place, and time.     Cranial Nerves: No cranial nerve deficit.     Sensory: No sensory deficit.  Psychiatric:        Mood and Affect: Mood normal.        Behavior: Behavior normal.        Thought Content: Thought content  normal.        Judgment: Judgment normal.        Assessment And Plan:     1. Encounter for general adult medical examination w/o abnormal findings - CBC  2. Diabetes mellitus with stage 3 chronic kidney disease ([place]) Comments: Stable, will change from [place] to Rybelsus, discussed importance of eating at least 30 minutes after taking. Diabetic foot exam done and essentially is normal. Encouraged to do regular foot checks - POCT Urinalysis Dipstick (81002) - POCT UA - Microalbumin - CMP14+EGFR - Hemoglobin A1c - Semaglutide (RYBELSUS) 7 MG TABS; Take 1 tablet by mouth daily. Take 30 minutes before breakfast  Dispense: 90 tablet; Refill: 0  3. Hypertensive nephropathy Comments: Blood pressure is fairly controlled.  EKG done with left axis anterior fascicular block and nonspecific T abnormality - EKG 12-Lead - CMP14+EGFR - losartan-hydrochlorothiazide (HYZAAR) 100-12.5 MG tablet; Take 1 tablet by mouth daily.  Dispense: 90 tablet; Refill: 1  4. Vitamin D deficiency Will check vitamin D level and supplement as needed.    Also encouraged to spend 15 minutes in the sun daily.   5. Pure hypercholesterolemia Stable, continue current medications - Lipid panel - atorvastatin (LIPITOR) 20 MG tablet; TAKE 1 TABLET(20 MG) BY MOUTH DAILY  Dispense: 90 tablet; Refill: 1  6. Immunization due Influenza vaccine administered Encouraged to take Tylenol as needed for fever or muscle aches. - Flu Vaccine QUAD High Dose(Fluad)  7. Body mass index (BMI) of 35.0 to 35.9 Chronic Discussed healthy diet and regular exercise options  Encouraged to exercise at least 150 minutes per week with 2 days  of strength training  8. Encounter for immunization She was also given pneumonia 23 in office - Pneumococcal polysaccharide vaccine 23-valent greater than or equal to 2yo subcutaneous/IM   Patient was given opportunity to ask questions. Patient verbalized understanding of the plan and was able to repeat key elements of the plan. All questions were answered to their satisfaction.   Minette Brine, FNP   I, Minette Brine, FNP, have reviewed all documentation for this visit. The documentation on 07/22/21 for the exam, diagnosis, procedures, and orders are all accurate and complete.   THE PATIENT IS ENCOURAGED TO PRACTICE SOCIAL DISTANCING DUE TO THE COVID-19 PANDEMIC.

## 2021-07-23 LAB — LIPID PANEL
Chol/HDL Ratio: 2.1 ratio (ref 0.0–4.4)
Cholesterol, Total: 122 mg/dL (ref 100–199)
HDL: 57 mg/dL (ref >39)
LDL Chol Calc (NIH): 51 mg/dL (ref 0–99)
Triglycerides: 67 mg/dL (ref 0–149)
VLDL Cholesterol Cal: 14 mg/dL (ref 5–40)

## 2021-07-23 LAB — CMP14+EGFR
ALT: 23 IU/L (ref 0–32)
AST: 25 IU/L (ref 0–40)
Albumin/Globulin Ratio: 1.4 (ref 1.2–2.2)
Albumin: 4.4 g/dL (ref 3.7–4.7)
Alkaline Phosphatase: 73 IU/L (ref 44–121)
BUN/Creatinine Ratio: 20 (ref 12–28)
BUN: 26 mg/dL (ref 8–27)
Bilirubin Total: 0.3 mg/dL (ref 0.0–1.2)
CO2: 25 mmol/L (ref 20–29)
Calcium: 10.5 mg/dL — ABNORMAL HIGH (ref 8.7–10.3)
Chloride: 103 mmol/L (ref 96–106)
Creatinine, Ser: 1.33 mg/dL — ABNORMAL HIGH (ref 0.57–1.00)
Globulin, Total: 3.1 g/dL (ref 1.5–4.5)
Glucose: 106 mg/dL — ABNORMAL HIGH (ref 70–99)
Potassium: 4 mmol/L (ref 3.5–5.2)
Sodium: 143 mmol/L (ref 134–144)
Total Protein: 7.5 g/dL (ref 6.0–8.5)
eGFR: 41 mL/min/{1.73_m2} — ABNORMAL LOW (ref >59)

## 2021-07-23 LAB — CBC
Hematocrit: 37.3 % (ref 34.0–46.6)
Hemoglobin: 11.8 g/dL (ref 11.1–15.9)
MCH: 27.6 pg (ref 26.6–33.0)
MCHC: 31.6 g/dL (ref 31.5–35.7)
MCV: 87 fL (ref 79–97)
Platelets: 309 10*3/uL (ref 150–450)
RBC: 4.28 x10E6/uL (ref 3.77–5.28)
RDW: 12.8 % (ref 11.7–15.4)
WBC: 5.1 10*3/uL (ref 3.4–10.8)

## 2021-07-23 LAB — HEMOGLOBIN A1C
Est. average glucose Bld gHb Est-mCnc: 154 mg/dL
Hgb A1c MFr Bld: 7 % — ABNORMAL HIGH (ref 4.8–5.6)

## 2021-07-24 ENCOUNTER — Ambulatory Visit: Payer: Medicare Other | Admitting: Nurse Practitioner

## 2021-09-25 DIAGNOSIS — E119 Type 2 diabetes mellitus without complications: Secondary | ICD-10-CM | POA: Diagnosis not present

## 2021-09-25 DIAGNOSIS — H40013 Open angle with borderline findings, low risk, bilateral: Secondary | ICD-10-CM | POA: Diagnosis not present

## 2021-09-25 DIAGNOSIS — H04123 Dry eye syndrome of bilateral lacrimal glands: Secondary | ICD-10-CM | POA: Diagnosis not present

## 2021-09-25 DIAGNOSIS — Z961 Presence of intraocular lens: Secondary | ICD-10-CM | POA: Diagnosis not present

## 2021-09-25 DIAGNOSIS — H31013 Macula scars of posterior pole (postinflammatory) (post-traumatic), bilateral: Secondary | ICD-10-CM | POA: Diagnosis not present

## 2021-09-25 DIAGNOSIS — H18232 Secondary corneal edema, left eye: Secondary | ICD-10-CM | POA: Diagnosis not present

## 2021-09-25 DIAGNOSIS — H26492 Other secondary cataract, left eye: Secondary | ICD-10-CM | POA: Diagnosis not present

## 2021-09-25 DIAGNOSIS — H1045 Other chronic allergic conjunctivitis: Secondary | ICD-10-CM | POA: Diagnosis not present

## 2021-09-25 LAB — HM DIABETES EYE EXAM

## 2021-09-26 ENCOUNTER — Encounter: Payer: Self-pay | Admitting: Nurse Practitioner

## 2021-10-16 ENCOUNTER — Other Ambulatory Visit: Payer: Self-pay | Admitting: Nurse Practitioner

## 2021-10-18 ENCOUNTER — Emergency Department (HOSPITAL_COMMUNITY): Payer: Medicare Other

## 2021-10-18 ENCOUNTER — Emergency Department (HOSPITAL_COMMUNITY)
Admission: EM | Admit: 2021-10-18 | Discharge: 2021-10-19 | Disposition: A | Discharge status: Home / Self Care | Payer: Medicare Other | Attending: Emergency Medicine | Admitting: Emergency Medicine

## 2021-10-18 DIAGNOSIS — E1122 Type 2 diabetes mellitus with diabetic chronic kidney disease: Secondary | ICD-10-CM | POA: Insufficient documentation

## 2021-10-18 DIAGNOSIS — M4696 Unspecified inflammatory spondylopathy, lumbar region: Secondary | ICD-10-CM | POA: Diagnosis not present

## 2021-10-18 DIAGNOSIS — R110 Nausea: Secondary | ICD-10-CM | POA: Diagnosis not present

## 2021-10-18 DIAGNOSIS — Z7984 Long term (current) use of oral hypoglycemic drugs: Secondary | ICD-10-CM | POA: Diagnosis not present

## 2021-10-18 DIAGNOSIS — M545 Low back pain, unspecified: Secondary | ICD-10-CM | POA: Diagnosis not present

## 2021-10-18 DIAGNOSIS — R739 Hyperglycemia, unspecified: Secondary | ICD-10-CM | POA: Diagnosis not present

## 2021-10-18 DIAGNOSIS — R1111 Vomiting without nausea: Secondary | ICD-10-CM | POA: Diagnosis not present

## 2021-10-18 DIAGNOSIS — R112 Nausea with vomiting, unspecified: Secondary | ICD-10-CM | POA: Insufficient documentation

## 2021-10-18 DIAGNOSIS — Z7982 Long term (current) use of aspirin: Secondary | ICD-10-CM | POA: Insufficient documentation

## 2021-10-18 DIAGNOSIS — R6889 Other general symptoms and signs: Secondary | ICD-10-CM | POA: Diagnosis not present

## 2021-10-18 DIAGNOSIS — N189 Chronic kidney disease, unspecified: Secondary | ICD-10-CM | POA: Insufficient documentation

## 2021-10-18 DIAGNOSIS — K449 Diaphragmatic hernia without obstruction or gangrene: Secondary | ICD-10-CM | POA: Diagnosis not present

## 2021-10-18 DIAGNOSIS — R109 Unspecified abdominal pain: Secondary | ICD-10-CM | POA: Insufficient documentation

## 2021-10-18 DIAGNOSIS — R0689 Other abnormalities of breathing: Secondary | ICD-10-CM | POA: Diagnosis not present

## 2021-10-18 DIAGNOSIS — K5790 Diverticulosis of intestine, part unspecified, without perforation or abscess without bleeding: Secondary | ICD-10-CM | POA: Diagnosis not present

## 2021-10-18 DIAGNOSIS — Z743 Need for continuous supervision: Secondary | ICD-10-CM | POA: Diagnosis not present

## 2021-10-18 LAB — COMPREHENSIVE METABOLIC PANEL
ALT: 23 U/L (ref 0–44)
AST: 26 U/L (ref 15–41)
Albumin: 3.6 g/dL (ref 3.5–5.0)
Alkaline Phosphatase: 67 U/L (ref 38–126)
Anion gap: 9 (ref 5–15)
BUN: 21 mg/dL (ref 8–23)
CO2: 25 mmol/L (ref 22–32)
Calcium: 9.9 mg/dL (ref 8.9–10.3)
Chloride: 103 mmol/L (ref 98–111)
Creatinine, Ser: 1.17 mg/dL — ABNORMAL HIGH (ref 0.44–1.00)
GFR, Estimated: 48 mL/min — ABNORMAL LOW (ref >60)
Glucose, Bld: 177 mg/dL — ABNORMAL HIGH (ref 70–99)
Potassium: 3.2 mmol/L — ABNORMAL LOW (ref 3.5–5.1)
Sodium: 137 mmol/L (ref 135–145)
Total Bilirubin: 0.7 mg/dL (ref 0.3–1.2)
Total Protein: 7.2 g/dL (ref 6.5–8.1)

## 2021-10-18 LAB — CBC WITH DIFFERENTIAL/PLATELET
Abs Immature Granulocytes: 0.03 10*3/uL (ref 0.00–0.07)
Basophils Absolute: 0 10*3/uL (ref 0.0–0.1)
Basophils Relative: 0 %
Eosinophils Absolute: 0.2 10*3/uL (ref 0.0–0.5)
Eosinophils Relative: 3 %
HCT: 35.3 % — ABNORMAL LOW (ref 36.0–46.0)
Hemoglobin: 11.4 g/dL — ABNORMAL LOW (ref 12.0–15.0)
Immature Granulocytes: 1 %
Lymphocytes Relative: 38 %
Lymphs Abs: 2.5 10*3/uL (ref 0.7–4.0)
MCH: 28 pg (ref 26.0–34.0)
MCHC: 32.3 g/dL (ref 30.0–36.0)
MCV: 86.7 fL (ref 80.0–100.0)
Monocytes Absolute: 0.5 10*3/uL (ref 0.1–1.0)
Monocytes Relative: 7 %
Neutro Abs: 3.3 10*3/uL (ref 1.7–7.7)
Neutrophils Relative %: 51 %
Platelets: 262 10*3/uL (ref 150–400)
RBC: 4.07 MIL/uL (ref 3.87–5.11)
RDW: 13.7 % (ref 11.5–15.5)
WBC: 6.5 10*3/uL (ref 4.0–10.5)
nRBC: 0 % (ref 0.0–0.2)

## 2021-10-18 MED ORDER — ONDANSETRON 4 MG PO TBDP
4.0000 mg | ORAL_TABLET | Freq: Once | ORAL | Status: AC
  Administered 2021-10-18: 4 mg via ORAL
  Filled 2021-10-18: qty 1

## 2021-10-18 MED ORDER — OXYCODONE-ACETAMINOPHEN 5-325 MG PO TABS
1.0000 | ORAL_TABLET | Freq: Once | ORAL | Status: AC
  Administered 2021-10-18: 1 via ORAL
  Filled 2021-10-18: qty 1

## 2021-10-18 NOTE — ED Triage Notes (Signed)
Pt. Stated, the pain is constant since this morning. Pt has not taken anything for the pain

## 2021-10-18 NOTE — ED Notes (Signed)
Pt was sleeping when she leaned forward and fell out of the wheelchair. Pt is alert and oriented and fellow [employer] in the lobby assisted with getting her into a recliner.

## 2021-10-18 NOTE — ED Triage Notes (Signed)
EMS  stated, called from home with left flank pain with nausea and vomiting.

## 2021-10-18 NOTE — ED Provider Triage Note (Signed)
Emergency Medicine Provider Triage Evaluation Note  Kristen Cannon , a 76 y.o. female  was evaluated in triage.  Pt complains of left-sided flank pain.  Patient states that she woke up this morning and after waking up she had sudden onset of sharp left-sided pain associated with nausea.  She states that she has had 2 episodes of nonbloody emesis.  She denies fevers.  Denies urinary symptoms including hematuria.  She denies abdominal pain, diarrhea.  Review of Systems  Positive: See above Negative:  Physical Exam  BP (!) 153/74 (BP Location: Left Arm)    Pulse 77    Temp 98 F (36.7 C) (Oral)    Resp (!) 22    SpO2 100%  Gen:   Awake, no distress   Resp:  Normal effort  MSK:   Moves extremities without difficulty  Other:  Abdomen is rounded, soft and nontender.  Mild left-sided CVA tenderness.  Medical Decision Making  Medically screening exam initiated at 12:47 PM.  Appropriate orders placed.  Kristen Cannon was informed that the remainder of the evaluation will be completed by another provider, this initial triage assessment does not replace that evaluation, and the importance of remaining in the ED until their evaluation is complete.     Kristen Hillier, PA-C 10/18/21 1248

## 2021-10-19 DIAGNOSIS — M545 Low back pain, unspecified: Secondary | ICD-10-CM | POA: Diagnosis not present

## 2021-10-19 LAB — URINALYSIS, ROUTINE W REFLEX MICROSCOPIC
Bilirubin Urine: NEGATIVE
Glucose, UA: NEGATIVE mg/dL
Hgb urine dipstick: NEGATIVE
Ketones, ur: NEGATIVE mg/dL
Leukocytes,Ua: NEGATIVE
Nitrite: NEGATIVE
Protein, ur: NEGATIVE mg/dL
Specific Gravity, Urine: >1.03 — ABNORMAL HIGH (ref 1.005–1.030)
pH: 5.5 (ref 5.0–8.0)

## 2021-10-19 MED ORDER — OXYCODONE-ACETAMINOPHEN 5-325 MG PO TABS: 0.5000 | ORAL_TABLET | ORAL | 0 refills | Status: DC | PRN

## 2021-10-19 MED ORDER — OXYCODONE-ACETAMINOPHEN 5-325 MG PO TABS
1.0000 | ORAL_TABLET | Freq: Once | ORAL | Status: DC
  Filled 2021-10-19: qty 1

## 2021-10-19 NOTE — ED Provider Notes (Signed)
[facility] EMERGENCY DEPARTMENT Provider Note   CSN: [id number] Arrival date & time: 10/18/21  1200     History  Chief Complaint  Patient presents with   Flank Pain   Nausea   Emesis    Kristen Cannon is a 76 y.o. female.  Patient presents to the emergency department for evaluation of left lower back pain.  Patient reports that the pain was present when she woke up this morning.  She has noticed that the pain worsens with movements.  She denies any injury.  Patient has had some nausea and vomiting today.  Denies urinary symptoms.  No chest pain, shortness of breath.      Home Medications Prior to Admission medications   Medication Sig Start Date End Date Taking? Authorizing Provider  oxyCODONE-acetaminophen (PERCOCET) 5-325 MG tablet Take 0.5 tablets by mouth every 4 (four) hours as needed. 10/19/21  Yes Helena Sardo, Gwenyth Allegra, MD  aspirin EC 81 MG tablet Take 81 mg by mouth every morning.    [provider]  atorvastatin (LIPITOR) 20 MG tablet TAKE 1 TABLET(20 MG) BY MOUTH DAILY 07/22/21   Minette Brine, FNP  glipiZIDE (GLUCOTROL) 10 MG tablet TAKE 1 TABLET BY MOUTH TWICE DAILY 06/20/21   Minette Brine, FNP  linaclotide [facility]) 72 MCG capsule Take 1 capsule (72 mcg total) by mouth daily before breakfast. As needed 12/13/20   Minette Brine, FNP  losartan-hydrochlorothiazide (HYZAAR) 100-12.5 MG tablet Take 1 tablet by mouth daily. 07/22/21   Minette Brine, FNP  Semaglutide (RYBELSUS) 7 MG TABS Take 1 tablet by mouth daily. Take 30 minutes before breakfast 07/22/21   Minette Brine, FNP  Vitamin D, Ergocalciferol, (DRISDOL) 1.25 MG (50000 UNIT) CAPS capsule TAKE 1 CAPSULE BY MOUTH 1 TIME A WEEK 12/03/20   Minette Brine, FNP      Allergies    Patient has no known allergies.    Review of Systems   Review of Systems  Physical Exam Updated Vital Signs BP 128/74 (BP Location: Right Arm)    Pulse 78    Temp 98.7 F (37.1 C) (Oral)    Resp 16     SpO2 100%  Physical Exam Vitals and nursing note reviewed.  Constitutional:      General: She is not in acute distress.    Appearance: Normal appearance. She is well-developed.  HENT:     Head: Normocephalic and atraumatic.     Right Ear: Hearing normal.     Left Ear: Hearing normal.     Nose: Nose normal.  Eyes:     Conjunctiva/sclera: Conjunctivae normal.     Pupils: Pupils are equal, round, and reactive to light.  Cardiovascular:     Rate and Rhythm: Regular rhythm.     Heart sounds: S1 normal and S2 normal. No murmur heard.   No friction rub. No gallop.  Pulmonary:     Effort: Pulmonary effort is normal. No respiratory distress.     Breath sounds: Normal breath sounds.  Chest:     Chest wall: No tenderness.  Abdominal:     General: Bowel sounds are normal.     Palpations: Abdomen is soft.     Tenderness: There is no abdominal tenderness. There is no guarding or rebound. Negative signs include Murphy's sign and McBurney's sign.     Hernia: No hernia is present.  Musculoskeletal:        General: Normal range of motion.     Cervical back: Normal range of motion and  neck supple.     Lumbar back: Tenderness present. Negative right straight leg raise test.       Back:  Skin:    General: Skin is warm and dry.     Findings: No rash.  Neurological:     Mental Status: She is alert and oriented to person, place, and time.     GCS: GCS eye subscore is 4. GCS verbal subscore is 5. GCS motor subscore is 6.     Cranial Nerves: No cranial nerve deficit.     Sensory: No sensory deficit.     Coordination: Coordination normal.  Psychiatric:        Speech: Speech normal.        Behavior: Behavior normal.        Thought Content: Thought content normal.    ED Results / Procedures / Treatments   Labs (all labs ordered are listed, but only abnormal results are displayed) Labs Reviewed  COMPREHENSIVE METABOLIC PANEL - Abnormal; Notable for the following components:      Result Value    Potassium 3.2 (*)    Glucose, Bld 177 (*)    Creatinine, Ser 1.17 (*)    GFR, Estimated 48 (*)    All other components within normal limits  CBC WITH DIFFERENTIAL/PLATELET - Abnormal; Notable for the following components:   Hemoglobin 11.4 (*)    HCT 35.3 (*)    All other components within normal limits  URINALYSIS, ROUTINE W REFLEX MICROSCOPIC - Abnormal; Notable for the following components:   Specific Gravity, Urine >1.030 (*)    All other components within normal limits  LIPASE, BLOOD    EKG None  Radiology CT Renal Stone Study  Result Date: 10/18/2021 CLINICAL DATA:  Left flank pain, nausea, vomiting EXAM: CT ABDOMEN AND PELVIS WITHOUT CONTRAST TECHNIQUE: Multidetector CT imaging of the abdomen and pelvis was performed following the standard protocol without IV contrast. COMPARISON:  Renal ultrasound 07/24/2020 FINDINGS: Lower chest: There is mild dependent subsegmental atelectasis in the lung bases. The imaged heart is unremarkable. Hepatobiliary: The liver and gallbladder are unremarkable. There is no biliary ductal dilatation. Pancreas: Unremarkable. Spleen: Unremarkable. Adrenals/Urinary Tract: Adrenals are unremarkable. The kidneys are unremarkable, with no focal lesion, stone, hydronephrosis, or hydroureter. No stone is seen along the course of either ureter. Bladder is unremarkable. Stomach/Bowel: There is a small hiatal hernia. The stomach is otherwise unremarkable. There is no evidence of bowel obstruction. There is a moderate stool burden in the rectum. There are scattered colonic diverticula without evidence of acute diverticulitis. Appendix is normal. Vascular/Lymphatic: There is scattered calcified atherosclerotic plaque in the nonaneurysmal abdominal aorta. There is no abdominal or pelvic lymphadenopathy. Reproductive: Uterus is surgically absent. There is no adnexal mass. Other: Is no ascites or free air. Musculoskeletal: There is no acute osseous abnormality or aggressive  osseous lesion. There is advanced facet arthropathy in the lower lumbar spine. IMPRESSION: 1. No acute findings in the abdomen or pelvis. No renal stones, hydronephrosis, or hydroureter. 2. Diverticulosis without evidence of acute diverticulitis. 3. Small hiatal hernia. 4.  Aortic Atherosclerosis (ICD10-I70.0). Electronically Signed   By: Valetta Mole M.D.   On: 10/18/2021 13:19    Procedures Procedures    Medications Ordered in ED Medications  oxyCODONE-acetaminophen (PERCOCET/ROXICET) 5-325 MG per tablet 1 tablet (has no administration in time range)  ondansetron (ZOFRAN-ODT) disintegrating tablet 4 mg (4 mg Oral Given 10/18/21 1540)  oxyCODONE-acetaminophen (PERCOCET/ROXICET) 5-325 MG per tablet 1 tablet (1 tablet Oral Given 10/18/21 1540)  ED Course/ Medical Decision Making/ A&P                           Medical Decision Making  Patient with past medical history of diabetes, chronic kidney disease, hypercholesterolemia presents to the emergency department with back pain that was present since awakening this morning.  Work-up has been reassuring.  This includes urinalysis, blood work, CT scan.  Patient does have reproducible pain consistent with musculoskeletal etiology.  Will treat with analgesia, follow-up with primary doctor.        Final Clinical Impression(s) / ED Diagnoses Final diagnoses:  Acute left-sided low back pain without sciatica    Rx / DC Orders ED Discharge Orders          Ordered    oxyCODONE-acetaminophen (PERCOCET) 5-325 MG tablet  Every 4 hours PRN        10/19/21 0332              Orpah Greek, MD 10/19/21 [phone]

## 2021-10-23 ENCOUNTER — Telehealth: Payer: Self-pay

## 2021-10-23 NOTE — Telephone Encounter (Signed)
Lvm for pt to give the office a call back, after visiting the ED on 10/18/2021. Given ext 206

## 2021-12-24 ENCOUNTER — Other Ambulatory Visit: Payer: Self-pay | Admitting: Nurse Practitioner

## 2021-12-26 ENCOUNTER — Other Ambulatory Visit: Payer: Self-pay | Admitting: Nurse Practitioner

## 2021-12-26 ENCOUNTER — Ambulatory Visit: Payer: Medicare Other

## 2021-12-26 ENCOUNTER — Other Ambulatory Visit: Payer: Self-pay

## 2021-12-26 ENCOUNTER — Encounter: Payer: Self-pay | Admitting: Nurse Practitioner

## 2021-12-26 ENCOUNTER — Ambulatory Visit (INDEPENDENT_AMBULATORY_CARE_PROVIDER_SITE_OTHER): Payer: Medicare Other | Admitting: Nurse Practitioner

## 2021-12-26 ENCOUNTER — Ambulatory Visit (INDEPENDENT_AMBULATORY_CARE_PROVIDER_SITE_OTHER): Payer: Medicare Other

## 2021-12-26 VITALS — BP 110/62 | HR 80 | Temp 98.6°F | Ht 60.6 in | Wt 157.8 lb

## 2021-12-26 DIAGNOSIS — I129 Hypertensive chronic kidney disease with stage 1 through stage 4 chronic kidney disease, or unspecified chronic kidney disease: Secondary | ICD-10-CM | POA: Diagnosis not present

## 2021-12-26 DIAGNOSIS — Z6830 Body mass index (BMI) 30.0-30.9, adult: Secondary | ICD-10-CM

## 2021-12-26 DIAGNOSIS — E559 Vitamin D deficiency, unspecified: Secondary | ICD-10-CM

## 2021-12-26 DIAGNOSIS — E7800 Pure hypercholesterolemia, unspecified: Secondary | ICD-10-CM

## 2021-12-26 DIAGNOSIS — E6609 Other obesity due to excess calories: Secondary | ICD-10-CM

## 2021-12-26 DIAGNOSIS — N1831 Chronic kidney disease, stage 3a: Secondary | ICD-10-CM | POA: Diagnosis not present

## 2021-12-26 DIAGNOSIS — E1122 Type 2 diabetes mellitus with diabetic chronic kidney disease: Secondary | ICD-10-CM

## 2021-12-26 DIAGNOSIS — Z0000 Encounter for general adult medical examination without abnormal findings: Secondary | ICD-10-CM

## 2021-12-26 DIAGNOSIS — N183 Chronic kidney disease, stage 3 unspecified: Secondary | ICD-10-CM

## 2021-12-26 DIAGNOSIS — R591 Generalized enlarged lymph nodes: Secondary | ICD-10-CM

## 2021-12-26 MED ORDER — LOSARTAN POTASSIUM 100 MG PO TABS: 100.0000 mg | ORAL_TABLET | Freq: Every day | ORAL | 1 refills | Status: DC

## 2021-12-26 NOTE — Patient Instructions (Signed)
Kristen Cannon , ?Thank you for taking time to come for your Medicare Wellness Visit. I appreciate your ongoing commitment to your health goals. Please review the following plan we discussed and let me know if I can assist you in the future.  ? ?Screening recommendations/referrals: ?Colonoscopy: not required ?Mammogram: not required ?Bone Density: completed 05/23/2020 ?Recommended yearly ophthalmology/optometry visit for glaucoma screening and checkup ?Recommended yearly dental visit for hygiene and checkup ? ?Vaccinations: ?Influenza vaccine: completed 07/22/2021, due next flu season ?Pneumococcal vaccine: completed 07/22/2021 ?Tdap vaccine: completed 01/08/2018, due 01/09/2028 ?Shingles vaccine: completed   ?Covid-19: 08/06/2020, 12/21/2019, 11/26/2019 ? ?Advanced directives: Advance directive discussed with you today. Even though you declined this today please call our office should you change your mind and we can give you the proper paperwork for you to fill out. ? ?Conditions/risks identified: none ? ?Next appointment: Follow up in one year for your annual wellness visit  ? ? ?Preventive Care 78 Years and Older, Female ?Preventive care refers to lifestyle choices and visits with your health care provider that can promote health and wellness. ?What does preventive care include? ?A yearly physical exam. This is also called an annual well check. ?Dental exams once or twice a year. ?Routine eye exams. Ask your health care provider how often you should have your eyes checked. ?Personal lifestyle choices, including: ?Daily care of your teeth and gums. ?Regular physical activity. ?Eating a healthy diet. ?Avoiding tobacco and drug use. ?Limiting alcohol use. ?Practicing safe sex. ?Taking low-dose aspirin every day. ?Taking vitamin and mineral supplements as recommended by your health care provider. ?What happens during an annual well check? ?The services and screenings done by your health care provider during your annual well  check will depend on your age, overall health, lifestyle risk factors, and family history of disease. ?Counseling  ?Your health care provider may ask you questions about your: ?Alcohol use. ?Tobacco use. ?Drug use. ?Emotional well-being. ?Home and relationship well-being. ?Sexual activity. ?Eating habits. ?History of falls. ?Memory and ability to understand (cognition). ?Work and work Statistician. ?Reproductive health. ?Screening  ?You may have the following tests or measurements: ?Height, weight, and BMI. ?Blood pressure. ?Lipid and cholesterol levels. These may be checked every 5 years, or more frequently if you are over 32 years old. ?Skin check. ?Lung cancer screening. You may have this screening every year starting at age 78 if you have a 30-pack-year history of smoking and currently smoke or have quit within the past 15 years. ?Fecal occult blood test (FOBT) of the stool. You may have this test every year starting at age 31. ?Flexible sigmoidoscopy or colonoscopy. You may have a sigmoidoscopy every 5 years or a colonoscopy every 10 years starting at age 78. ?Hepatitis C blood test. ?Hepatitis B blood test. ?Sexually transmitted disease (STD) testing. ?Diabetes screening. This is done by checking your blood sugar (glucose) after you have not eaten for a while (fasting). You may have this done every 1-3 years. ?Bone density scan. This is done to screen for osteoporosis. You may have this done starting at age 78. ?Mammogram. This may be done every 1-2 years. Talk to your health care provider about how often you should have regular mammograms. ?Talk with your health care provider about your test results, treatment options, and if necessary, the need for more tests. ?Vaccines  ?Your health care provider may recommend certain vaccines, such as: ?Influenza vaccine. This is recommended every year. ?Tetanus, diphtheria, and acellular pertussis (Tdap, Td) vaccine. You may need a Td  booster every 10 years. ?Zoster  vaccine. You may need this after age 55. ?Pneumococcal 13-valent conjugate (PCV13) vaccine. One dose is recommended after age 78. ?Pneumococcal polysaccharide (PPSV23) vaccine. One dose is recommended after age 78. ?Talk to your health care provider about which screenings and vaccines you need and how often you need them. ?This information is not intended to replace advice given to you by your health care provider. Make sure you discuss any questions you have with your health care provider. ?Document Released: 10/26/2015 Document Revised: 06/18/2016 Document Reviewed: 07/31/2015 ?Elsevier Interactive Patient Education ? 2017 [place]. ? ?Fall Prevention in the Home ?Falls can cause injuries. They can happen to people of all ages. There are many things you can do to make your home safe and to help prevent falls. ?What can I do on the outside of my home? ?Regularly fix the edges of walkways and driveways and fix any cracks. ?Remove anything that might make you trip as you walk through a door, such as a raised step or threshold. ?Trim any bushes or trees on the path to your home. ?Use bright outdoor lighting. ?Clear any walking paths of anything that might make someone trip, such as rocks or tools. ?Regularly check to see if handrails are loose or broken. Make sure that both sides of any steps have handrails. ?Any raised decks and porches should have guardrails on the edges. ?Have any leaves, snow, or ice cleared regularly. ?Use sand or salt on walking paths during winter. ?Clean up any spills in your garage right away. This includes oil or grease spills. ?What can I do in the bathroom? ?Use night lights. ?Install grab bars by the toilet and in the tub and shower. Do not use towel bars as grab bars. ?Use non-skid mats or decals in the tub or shower. ?If you need to sit down in the shower, use a plastic, non-slip stool. ?Keep the floor dry. Clean up any water that spills on the floor as soon as it happens. ?Remove  soap buildup in the tub or shower regularly. ?Attach bath mats securely with double-sided non-slip rug tape. ?Do not have throw rugs and other things on the floor that can make you trip. ?What can I do in the bedroom? ?Use night lights. ?Make sure that you have a light by your bed that is easy to reach. ?Do not use any sheets or blankets that are too big for your bed. They should not hang down onto the floor. ?Have a firm chair that has side arms. You can use this for support while you get dressed. ?Do not have throw rugs and other things on the floor that can make you trip. ?What can I do in the kitchen? ?Clean up any spills right away. ?Avoid walking on wet floors. ?Keep items that you use a lot in easy-to-reach places. ?If you need to reach something above you, use a strong step stool that has a grab bar. ?Keep electrical cords out of the way. ?Do not use floor polish or wax that makes floors slippery. If you must use wax, use non-skid floor wax. ?Do not have throw rugs and other things on the floor that can make you trip. ?What can I do with my stairs? ?Do not leave any items on the stairs. ?Make sure that there are handrails on both sides of the stairs and use them. Fix handrails that are broken or loose. Make sure that handrails are as long as the stairways. ?Check any carpeting  to make sure that it is firmly attached to the stairs. Fix any carpet that is loose or worn. ?Avoid having throw rugs at the top or bottom of the stairs. If you do have throw rugs, attach them to the floor with carpet tape. ?Make sure that you have a light switch at the top of the stairs and the bottom of the stairs. If you do not have them, ask someone to add them for you. ?What else can I do to help prevent falls? ?Wear shoes that: ?Do not have high heels. ?Have rubber bottoms. ?Are comfortable and fit you well. ?Are closed at the toe. Do not wear sandals. ?If you use a stepladder: ?Make sure that it is fully opened. Do not climb a  closed stepladder. ?Make sure that both sides of the stepladder are locked into place. ?Ask someone to hold it for you, if possible. ?Clearly mark and make sure that you can see: ?Any grab bars or handrails. ?

## 2021-12-26 NOTE — Progress Notes (Signed)
?This visit occurred during the SARS-CoV-2 public health emergency.  Safety protocols were in place, including screening questions prior to the visit, additional usage of staff PPE, and extensive cleaning of exam room while observing appropriate contact time as indicated for disinfecting solutions. ? ?Subjective:  ? Kristen Cannon is a 76 y.o. female who presents for Medicare Annual (Subsequent) preventive examination. ? ?Review of Systems    ? ?Cardiac Risk Factors include: advanced age (>44mn, >>93women);diabetes mellitus;hypertension;obesity (BMI >30kg/m2) ? ?   ?Objective:  ?  ?Today's Vitals  ? 12/26/21 1025  ?BP: 110/62  ?Pulse: 80  ?Temp: 98.6 ?F (37 ?C)  ?TempSrc: Oral  ?Weight: 157 lb 12.8 oz (71.6 kg)  ?Height: 5' 0.6" (1.539 m)  ? ?Body mass index is 30.21 kg/m?. ? ?Advanced Directives 12/26/2021 12/13/2020 12/07/2019 07/14/2019  ?Does Patient Have a Medical Advance Directive? No No No No  ?Would patient like information on creating a medical advance directive? No - Patient declined No - Patient declined - Yes (MAU/Ambulatory/Procedural Areas - Information given)  ? ? ?Current Medications (verified) ?Outpatient Encounter Medications as of 12/26/2021  ?Medication Sig  ? aspirin EC 81 MG tablet Take 81 mg by mouth every morning.  ? atorvastatin (LIPITOR) 20 MG tablet TAKE 1 TABLET(20 MG) BY MOUTH DAILY  ? glipiZIDE (GLUCOTROL) 10 MG tablet TAKE 1 TABLET BY MOUTH TWICE DAILY  ? linaclotide (LINZESS) 72 MCG capsule Take 1 capsule (72 mcg total) by mouth daily before breakfast. As needed  ? losartan-hydrochlorothiazide (HYZAAR) 100-12.5 MG tablet Take 1 tablet by mouth daily.  ? Semaglutide (RYBELSUS) 7 MG TABS Take 1 tablet by mouth daily. Take 30 minutes before breakfast  ? oxyCODONE-acetaminophen (PERCOCET) 5-325 MG tablet Take 0.5 tablets by mouth every 4 (four) hours as needed. (Patient not taking: Reported on 12/26/2021)  ? Vitamin D, Ergocalciferol, (DRISDOL) 1.25 MG (50000 UNIT) CAPS capsule TAKE  1 CAPSULE BY MOUTH 1 TIME A WEEK (Patient not taking: Reported on 12/26/2021)  ? ?No facility-administered encounter medications on file as of 12/26/2021.  ? ? ?Allergies (verified) ?Patient has no known allergies.  ? ?History: ?Past Medical History:  ?Diagnosis Date  ? Diabetes mellitus   ? High cholesterol   ? Hypertension   ? ?Past Surgical History:  ?Procedure Laterality Date  ? CATARACT EXTRACTION Left 12/2018  ? DR GKaty Fitch ? PARTIAL HYSTERECTOMY  1974  ? ?Family History  ?Problem Relation Age of Onset  ? Breast cancer Sister   ? Breast cancer Daughter   ? Early death Mother   ? Early death Father   ? ?Social History  ? ?Socioeconomic History  ? Marital status: Divorced  ?  Spouse name: Not on file  ? Number of children: Not on file  ? Years of education: Not on file  ? Highest education level: Not on file  ?Occupational History  ? Occupation: retired  ?Tobacco Use  ? Smoking status: Former  ?  Packs/day: 1.00  ?  Years: 12.00  ?  Pack years: 12.00  ?  Types: Cigars, Cigarettes  ?  Quit date: 2007  ?  Years since quitting: 16.2  ? Smokeless tobacco: Never  ? Tobacco comments:  ?  1 cigar a week  ?Vaping Use  ? Vaping Use: Never used  ?Substance and Sexual Activity  ? Alcohol use: Never  ? Drug use: No  ? Sexual activity: Not Currently  ?Other Topics Concern  ? Not on file  ?Social History Narrative  ? Not on  file  ? ?Social Determinants of Health  ? ?Financial Resource Strain: Low Risk   ? Difficulty of Paying Living Expenses: Not hard at all  ?Food Insecurity: No Food Insecurity  ? Worried About Charity fundraiser in the Last Year: Never true  ? Ran Out of Food in the Last Year: Never true  ?Transportation Needs: No Transportation Needs  ? Lack of Transportation (Medical): No  ? Lack of Transportation (Non-Medical): No  ?Physical Activity: Inactive  ? Days of Exercise per Week: 0 days  ? Minutes of Exercise per Session: 0 min  ?Stress: No Stress Concern Present  ? Feeling of Stress : Only a little  ?Social  Connections: Not on file  ? ? ?Tobacco Counseling ?Counseling given: Not Answered ?Tobacco comments: 1 cigar a week ? ? ?Clinical Intake: ? ?Pre-visit preparation completed: Yes ? ?Pain : No/denies pain ? ?  ? ?Nutritional Risks: None ?Diabetes: Yes ? ?How often do you need to have someone help you when you read instructions, pamphlets, or other written materials from your doctor or pharmacy?: 1 - Never ?What is the last grade level you completed in school?: 9th grade ? ?Diabetic? Yes ?Nutrition Risk Assessment: ? ?Has the patient had any N/V/D within the last 2 months?  No  ?Does the patient have any non-healing wounds?  No  ?Has the patient had any unintentional weight loss or weight gain?  Yes  ? ?Diabetes: ? ?Is the patient diabetic?  Yes  ?If diabetic, was a CBG obtained today?  No  ?Did the patient bring in their glucometer from home?  No  ?How often do you monitor your CBG's? Does not  ? ?Financial Strains and Diabetes Management: ? ?Are you having any financial strains with the device, your supplies or your medication? No .  ?Does the patient want to be seen by Chronic Care Management for management of their diabetes?  No  ?Would the patient like to be referred to a Nutritionist or for Diabetic Management?  No  ? ?Diabetic Exams: ? ?Diabetic Eye Exam: Completed 09/25/2021 ?Diabetic Foot Exam: Overdue, Pt has been advised about the importance in completing this exam. Pt is scheduled for diabetic foot exam on next appointment. ? ? ?Interpreter Needed?: No ? ?Information entered by :: NAllen LPN ? ? ?Activities of Daily Living ?In your present state of health, do you have any difficulty performing the following activities: 12/26/2021  ?Hearing? N  ?Vision? Y  ?Comment some blurriness in left eye  ?Difficulty concentrating or making decisions? Y  ?Walking or climbing stairs? Y  ?Dressing or bathing? N  ?Doing errands, shopping? N  ?Preparing Food and eating ? N  ?Using the Toilet? N  ?In the past six months, have  you accidently leaked urine? N  ?Do you have problems with loss of bowel control? N  ?Managing your Medications? N  ?Managing your Finances? N  ?Housekeeping or managing your Housekeeping? N  ?Some recent data might be hidden  ? ? ?Patient Care Team: ?Minette Brine, FNP as PCP - General (General Practice) ? ?Indicate any recent Medical Services you may have received from other than [facility] providers in the past year (date may be approximate). ? ?   ?Assessment:  ? This is a routine wellness examination for [facility]. ? ?Hearing/Vision screen ?No results found. ? ?Dietary issues and exercise activities discussed: ?Current Exercise Habits: The patient does not participate in regular exercise at present ? ? Goals Addressed   ? ?  ?  ?  ?  ?  This Visit's Progress  ?  Patient Stated     ?  12/26/2021, gain weight  ?  ? ?  ? ?Depression Screen ?PHQ 2/9 Scores 12/26/2021 12/13/2020 01/03/2020 12/07/2019 07/14/2019 02/15/2019 11/17/2018  ?PHQ - 2 Score 6 0 0 0 0 3 0  ?PHQ- 9 Score 11 - - 4 0 12 -  ?  ?Fall Risk ?Fall Risk  12/26/2021 12/13/2020 01/03/2020 12/07/2019 07/14/2019  ?Falls in the past year? 0 0 0 0 0  ?Number falls in past yr: - - - - 0  ?Risk for fall due to : Medication side effect Mental status change - Medication side effect Medication side effect  ?Follow up Falls evaluation completed;Education provided;Falls prevention discussed Falls evaluation completed;Education provided;Falls prevention discussed - Education provided;Falls prevention discussed;Falls evaluation completed Falls evaluation completed;Education provided;Falls prevention discussed  ? ? ?FALL RISK PREVENTION PERTAINING TO THE HOME: ? ?Any stairs in or around the home? Yes  ?If so, are there any without handrails? No  ?Home free of loose throw rugs in walkways, pet beds, electrical cords, etc? Yes  ?Adequate lighting in your home to reduce risk of falls? Yes  ? ?ASSISTIVE DEVICES UTILIZED TO PREVENT FALLS: ? ?Life alert? No  ?Use of a cane, walker or w/c? No   ?Grab bars in the bathroom? Yes  ?Shower chair or bench in shower? No  ?Elevated toilet seat or a handicapped toilet? Yes  ? ?TIMED UP AND GO: ? ?Was the test performed? No .  ? ? ?Gait steady and fast with

## 2021-12-26 NOTE — Progress Notes (Signed)
?I,Yamilka J Llittleton,acting as a Education administrator for [employer], FNP.,have documented all relevant documentation on the behalf of Minette Brine, FNP,as directed by  Minette Brine, FNP while in the presence of Minette Brine, [place].  ? ?This visit occurred during the SARS-CoV-2 public health emergency.  Safety protocols were in place, including screening questions prior to the visit, additional usage of staff PPE, and extensive cleaning of exam room while observing appropriate contact time as indicated for disinfecting solutions. ? ?Subjective:  ?  ? Patient ID: Kristen Cannon , female    DOB: [birth year] , 76 y.o.   MRN: [id number] ? ? ?Chief Complaint  ?Patient presents with  ? Diabetes  ? Hypertension  ? ? ?HPI ? ?Patient presents today for a blood pressure and dm f/u ? ?Wt Readings from Last 3 Encounters: ?12/26/21 : 157 lb 12.8 oz (71.6 kg) ?07/22/21 : 174 lb 3.2 oz (79 kg) ?04/22/21 : 177 lb (80.3 kg) ?  ?  ? ?Diabetes ?She presents for her follow-up diabetic visit. She has type 2 diabetes mellitus. There are no hypoglycemic associated symptoms. Pertinent negatives for hypoglycemia include no dizziness or headaches. There are no diabetic associated symptoms. Pertinent negatives for diabetes include no blurred vision, no chest pain, no fatigue, no polydipsia, no polyphagia and no polyuria. There are no hypoglycemic complications. There are no diabetic complications. Risk factors for coronary artery disease include obesity and sedentary lifestyle. Current diabetic treatment includes oral agent (dual therapy). She is compliant with treatment all of the time. When asked about meal planning, she reported none. She has not had a previous visit with a dietitian. (She is not checking her blood sugar) She does not see a podiatrist.Eye exam is not current.  ?Hypertension ?This is a chronic problem. The current episode started more than 1 year ago. The problem is controlled. Pertinent negatives include no blurred vision,  chest pain, headaches, palpitations or shortness of breath.   ? ?Past Medical History:  ?Diagnosis Date  ? Diabetes mellitus   ? High cholesterol   ? Hypertension   ?  ? ?Family History  ?Problem Relation Age of Onset  ? Breast cancer Sister   ? Breast cancer Daughter   ? Early death Mother   ? Early death Father   ? ? ? ?Current Outpatient Medications:  ?  losartan (COZAAR) 100 MG tablet, Take 1 tablet (100 mg total) by mouth daily., Disp: 90 tablet, Rfl: 1 ?  aspirin EC 81 MG tablet, Take 81 mg by mouth every morning., Disp: , Rfl:  ?  atorvastatin (LIPITOR) 20 MG tablet, TAKE 1 TABLET(20 MG) BY MOUTH DAILY, Disp: 90 tablet, Rfl: 1 ?  glipiZIDE (GLUCOTROL) 10 MG tablet, TAKE 1 TABLET BY MOUTH TWICE DAILY, Disp: 180 tablet, Rfl: 0 ?  linaclotide (LINZESS) 72 MCG capsule, Take 1 capsule (72 mcg total) by mouth daily before breakfast. As needed, Disp: 90 capsule, Rfl: 1 ?  oxyCODONE-acetaminophen (PERCOCET) 5-325 MG tablet, Take 0.5 tablets by mouth every 4 (four) hours as needed. (Patient not taking: Reported on 12/26/2021), Disp: 10 tablet, Rfl: 0 ?  Semaglutide (RYBELSUS) 7 MG TABS, Take 1 tablet by mouth daily. Take 30 minutes before breakfast, Disp: 90 tablet, Rfl: 0 ?  Vitamin D, Ergocalciferol, (DRISDOL) 1.25 MG (50000 UNIT) CAPS capsule, TAKE 1 CAPSULE BY MOUTH 1 TIME A WEEK (Patient not taking: Reported on 12/26/2021), Disp: 12 capsule, Rfl: 1  ? ?No Known Allergies  ? ?Review of Systems  ?Constitutional:  Negative for fatigue.  ?  Eyes:  Negative for blurred vision.  ?Respiratory:  Negative for shortness of breath.   ?Cardiovascular:  Negative for chest pain and palpitations.  ?Endocrine: Negative for polydipsia, polyphagia and polyuria.  ?Neurological:  Negative for dizziness and headaches.   ? ?Today's Vitals  ? 12/26/21 1053  ?BP: 110/62  ?Pulse: 80  ?Temp: 98.6 ?F (37 ?C)  ?Weight: 157 lb 13.6 oz (71.6 kg)  ?Height: 5' 0.6" (1.539 m)  ? ?Body mass index is 30.22 kg/m?.  ? ?Objective:  ?Physical Exam ?Vitals  reviewed.  ?Constitutional:   ?   General: She is not in acute distress. ?   Appearance: Normal appearance. She is obese.  ?Cardiovascular:  ?   Rate and Rhythm: Normal rate and regular rhythm.  ?   Pulses: Normal pulses.  ?   Heart sounds: Normal heart sounds. No murmur heard. ?Pulmonary:  ?   Effort: Pulmonary effort is normal. No respiratory distress.  ?   Breath sounds: Normal breath sounds. No wheezing.  ?Abdominal:  ?   General: Abdomen is flat. Bowel sounds are normal.  ?   Palpations: Abdomen is soft.  ?Musculoskeletal:  ?   Cervical back: Normal range of motion and neck supple.  ?Skin: ?   General: Skin is warm and dry.  ?   Capillary Refill: Capillary refill takes less than 2 seconds.  ?Neurological:  ?   General: No focal deficit present.  ?   Mental Status: She is alert and oriented to person, place, and time.  ?   Cranial Nerves: No cranial nerve deficit.  ?   Motor: No weakness.  ?Psychiatric:     ?   Mood and Affect: Mood normal.     ?   Behavior: Behavior normal.     ?   Thought Content: Thought content normal.     ?   Judgment: Judgment normal.  ?  ? ?   ?Assessment And Plan:  ?   ?1. Diabetes mellitus with stage 3 chronic kidney disease ([place]) ?Comments: discussed how Rybelsus works with gastric emptying which can lead to weight loss. Reassured she is doing well. Diabetic foot exam done, decreased ?- losartan (COZAAR) 100 MG tablet; Take 1 tablet (100 mg total) by mouth daily.  Dispense: 90 tablet; Refill: 1 ?- Hemoglobin A1c ?- CBC ? ?2. Hypertensive nephropathy ?Comments: Dc'd HCTZ due to stage 3 CKD to see if there is any improvement to her kidney functions. Return in 4 weeks for [place] BP check and BMP ? ?3. Class 1 obesity due to excess calories with serious comorbidity and body mass index (BMI) of 30.0 to 30.9 in adult ?Chronic ?Discussed healthy diet and regular exercise options  ?Encouraged to exercise at least 150 minutes per week with 2 days of strength training ?Given handout for [email or web address]  10 tips, CDC exercise for adults and CDC Eat ? ?4. Pure hypercholesterolemia ?Stable, continue statin. Tolerating well ?- CBC ?- Lipid panel ? ?5. Vitamin D deficiency ?Will check vitamin D level and supplement as needed.    ?Also encouraged to spend 15 minutes in the sun daily.  ?- VITAMIN D 25 Hydroxy (Vit-D Deficiency, Fractures) ? ?6. Lymphadenopathy ?Enlarged lymph nodes to neck, will check thyroid scan ?- US Soft Tissue Head/Neck (NON-THYROID); Future ?  ? ?Patient was given opportunity to ask questions. Patient verbalized understanding of the plan and was able to repeat key elements of the plan. All questions were answered to their satisfaction.  ?Minette Brine, FNP  ? ?I,  Minette Brine, FNP, have reviewed all documentation for this visit. The documentation on 12/26/21 for the exam, diagnosis, procedures, and orders are all accurate and complete.  ? ?IF YOU HAVE BEEN REFERRED TO A SPECIALIST, IT MAY TAKE 1-2 WEEKS TO SCHEDULE/PROCESS THE REFERRAL. IF YOU HAVE NOT HEARD FROM US/SPECIALIST IN TWO WEEKS, PLEASE GIVE [place] A CALL AT [phone] X [phone].  ? ?THE PATIENT IS ENCOURAGED TO PRACTICE SOCIAL DISTANCING DUE TO THE COVID-19 PANDEMIC.   ?

## 2021-12-26 NOTE — Patient Instructions (Addendum)

## 2021-12-27 LAB — CBC
Hematocrit: 37.1 % (ref 34.0–46.6)
Hemoglobin: 12.3 g/dL (ref 11.1–15.9)
MCH: 27.8 pg (ref 26.6–33.0)
MCHC: 33.2 g/dL (ref 31.5–35.7)
MCV: 84 fL (ref 79–97)
Platelets: 290 10*3/uL (ref 150–450)
RBC: 4.43 x10E6/uL (ref 3.77–5.28)
RDW: 13 % (ref 11.7–15.4)
WBC: 5.5 10*3/uL (ref 3.4–10.8)

## 2021-12-27 LAB — HEMOGLOBIN A1C
Est. average glucose Bld gHb Est-mCnc: 134 mg/dL
Hgb A1c MFr Bld: 6.3 % — ABNORMAL HIGH (ref 4.8–5.6)

## 2021-12-27 LAB — LIPID PANEL
Chol/HDL Ratio: 2.2 ratio (ref 0.0–4.4)
Cholesterol, Total: 145 mg/dL (ref 100–199)
HDL: 66 mg/dL (ref >39)
LDL Chol Calc (NIH): 65 mg/dL (ref 0–99)
Triglycerides: 71 mg/dL (ref 0–149)
VLDL Cholesterol Cal: 14 mg/dL (ref 5–40)

## 2021-12-27 LAB — VITAMIN D 25 HYDROXY (VIT D DEFICIENCY, FRACTURES): Vit D, 25-Hydroxy: 42.5 ng/mL (ref 30.0–100.0)

## 2022-01-01 DIAGNOSIS — E1122 Type 2 diabetes mellitus with diabetic chronic kidney disease: Secondary | ICD-10-CM | POA: Diagnosis not present

## 2022-01-01 DIAGNOSIS — N1831 Chronic kidney disease, stage 3a: Secondary | ICD-10-CM | POA: Diagnosis not present

## 2022-01-01 DIAGNOSIS — I129 Hypertensive chronic kidney disease with stage 1 through stage 4 chronic kidney disease, or unspecified chronic kidney disease: Secondary | ICD-10-CM | POA: Diagnosis not present

## 2022-01-01 DIAGNOSIS — E785 Hyperlipidemia, unspecified: Secondary | ICD-10-CM | POA: Diagnosis not present

## 2022-01-06 ENCOUNTER — Ambulatory Visit
Admission: RE | Admit: 2022-01-06 | Discharge: 2022-01-06 | Disposition: A | Discharge status: Home / Self Care | Payer: Medicare Other | Source: Ambulatory Visit | Attending: Nurse Practitioner | Admitting: Nurse Practitioner

## 2022-01-06 DIAGNOSIS — R591 Generalized enlarged lymph nodes: Secondary | ICD-10-CM

## 2022-01-06 DIAGNOSIS — R221 Localized swelling, mass and lump, neck: Secondary | ICD-10-CM | POA: Diagnosis not present

## 2022-01-21 ENCOUNTER — Ambulatory Visit: Payer: Medicare Other

## 2022-01-21 ENCOUNTER — Other Ambulatory Visit: Payer: Self-pay

## 2022-01-21 VITALS — BP 116/72 | HR 89 | Temp 98.4°F | Ht 60.6 in

## 2022-01-21 DIAGNOSIS — I129 Hypertensive chronic kidney disease with stage 1 through stage 4 chronic kidney disease, or unspecified chronic kidney disease: Secondary | ICD-10-CM

## 2022-01-21 DIAGNOSIS — N1832 Chronic kidney disease, stage 3b: Secondary | ICD-10-CM

## 2022-01-21 NOTE — Addendum Note (Signed)
Addended by: Michelle Nasuti on: 01/21/2022 12:29 PM ? ? Modules accepted: Orders ? ?

## 2022-01-21 NOTE — Progress Notes (Signed)
The patient is here today for a blood pressure check.  The patient was told that Laurance Flatten, DNP-FNP-BC said to continue with her current medications.  ?

## 2022-01-22 ENCOUNTER — Ambulatory Visit: Payer: Medicare Other | Admitting: Nurse Practitioner

## 2022-01-22 LAB — BMP8+EGFR
BUN/Creatinine Ratio: 15 (ref 12–28)
BUN: 17 mg/dL (ref 8–27)
CO2: 26 mmol/L (ref 20–29)
Calcium: 10.2 mg/dL (ref 8.7–10.3)
Chloride: 106 mmol/L (ref 96–106)
Creatinine, Ser: 1.11 mg/dL — ABNORMAL HIGH (ref 0.57–1.00)
Glucose: 103 mg/dL — ABNORMAL HIGH (ref 70–99)
Potassium: 3.9 mmol/L (ref 3.5–5.2)
Sodium: 143 mmol/L (ref 134–144)
eGFR: 51 mL/min/{1.73_m2} — ABNORMAL LOW (ref >59)

## 2022-01-28 ENCOUNTER — Other Ambulatory Visit: Payer: Self-pay | Admitting: Nurse Practitioner

## 2022-01-28 DIAGNOSIS — E7800 Pure hypercholesterolemia, unspecified: Secondary | ICD-10-CM

## 2022-02-05 ENCOUNTER — Other Ambulatory Visit: Payer: Self-pay | Admitting: Nurse Practitioner

## 2022-02-05 DIAGNOSIS — N183 Chronic kidney disease, stage 3 unspecified: Secondary | ICD-10-CM

## 2022-03-24 ENCOUNTER — Other Ambulatory Visit: Payer: Self-pay

## 2022-03-24 MED ORDER — GLIPIZIDE 10 MG PO TABS: 10.0000 mg | ORAL_TABLET | Freq: Two times a day (BID) | ORAL | 0 refills | Status: DC

## 2022-04-07 ENCOUNTER — Encounter: Payer: Self-pay | Admitting: Nurse Practitioner

## 2022-04-07 ENCOUNTER — Ambulatory Visit (INDEPENDENT_AMBULATORY_CARE_PROVIDER_SITE_OTHER): Payer: Medicare Other | Admitting: Nurse Practitioner

## 2022-04-07 VITALS — BP 124/78 | HR 79 | Temp 98.2°F | Ht 60.6 in | Wt 146.0 lb

## 2022-04-07 DIAGNOSIS — I129 Hypertensive chronic kidney disease with stage 1 through stage 4 chronic kidney disease, or unspecified chronic kidney disease: Secondary | ICD-10-CM

## 2022-04-07 DIAGNOSIS — E7800 Pure hypercholesterolemia, unspecified: Secondary | ICD-10-CM | POA: Diagnosis not present

## 2022-04-07 DIAGNOSIS — E1122 Type 2 diabetes mellitus with diabetic chronic kidney disease: Secondary | ICD-10-CM

## 2022-04-07 DIAGNOSIS — N183 Chronic kidney disease, stage 3 unspecified: Secondary | ICD-10-CM | POA: Diagnosis not present

## 2022-04-07 NOTE — Progress Notes (Signed)
I,Kristen Cannon,acting as a Education administrator for [employer], FNP.,have documented all relevant documentation on the behalf of Kristen Brine, FNP,as directed by  Kristen Brine, FNP while in the presence of Kristen Cannon, Kristen Cannon.  Subjective:     Patient ID: Kristen Cannon , female    DOB: 06-25-1946 , 76 y.o.   MRN: [id number]   Chief Complaint  Patient presents with   Diabetes    HPI  Patient presents today for a blood pressure and dm f/u. She feels the Rybelsus is causing her to lose too much weight.  Wt Readings from Last 3 Encounters: 04/07/22 : 146 lb (66.2 kg) 12/26/21 : 157 lb 13.6 oz (71.6 kg) 12/26/21 : 157 lb 12.8 oz (71.6 kg)    Diabetes She presents for her follow-up diabetic visit. She has type 2 diabetes mellitus. There are no hypoglycemic associated symptoms. Pertinent negatives for hypoglycemia include no dizziness or headaches. There are no diabetic associated symptoms. Pertinent negatives for diabetes include no blurred vision, no chest pain, no fatigue, no polydipsia, no polyphagia and no polyuria. There are no hypoglycemic complications. There are no diabetic complications. Risk factors for coronary artery disease include obesity and sedentary lifestyle. Current diabetic treatment includes oral agent (dual therapy). She is compliant with treatment all of the time. When asked about meal planning, she reported none. She has not had a previous visit with a dietitian. (She is not checking her blood sugar) She does not see a podiatrist.Eye exam is not current.  Hypertension This is a chronic problem. The current episode started more than 1 year ago. The problem is controlled. Pertinent negatives include no blurred vision, chest pain, headaches, palpitations or shortness of breath.     Past Medical History:  Diagnosis Date   Diabetes mellitus    High cholesterol    Hypertension      Family History  Problem Relation Age of Onset   Breast cancer Sister    Breast cancer  Daughter    Early death Mother    Early death Father      Current Outpatient Medications:    aspirin EC 81 MG tablet, Take 81 mg by mouth every morning., Disp: , Rfl:    atorvastatin (LIPITOR) 20 MG tablet, TAKE 1 TABLET(20 MG) BY MOUTH DAILY, Disp: 90 tablet, Rfl: 1   glipiZIDE (GLUCOTROL) 10 MG tablet, Take 1 tablet (10 mg total) by mouth 2 (two) times daily., Disp: 180 tablet, Rfl: 0   linaclotide (LINZESS) 72 MCG capsule, Take 1 capsule (72 mcg total) by mouth daily before breakfast. As needed, Disp: 90 capsule, Rfl: 1   losartan (COZAAR) 100 MG tablet, Take 1 tablet (100 mg total) by mouth daily., Disp: 90 tablet, Rfl: 1   RYBELSUS 7 MG TABS, TAKE 1 TABLET BY MOUTH DAILY 30 MINUTES BEFORE BREAKFAST, Disp: 90 tablet, Rfl: 0   No Known Allergies   Review of Systems  Constitutional: Negative.  Negative for fatigue.  Eyes:  Negative for blurred vision.  Respiratory: Negative.  Negative for shortness of breath.   Cardiovascular: Negative.  Negative for chest pain and palpitations.  Gastrointestinal: Negative.   Endocrine: Negative for polydipsia, polyphagia and polyuria.  Neurological: Negative.  Negative for dizziness and headaches.  Psychiatric/Behavioral: Negative.       Today's Vitals   04/07/22 1049 04/07/22 1127  BP: 134/80 124/78  Pulse: 79   Temp: 98.2 F (36.8 C)   TempSrc: Oral   Weight: 146 lb (66.2 kg)   Height: 5' 0.6" (  1.539 m)    Body mass index is 27.95 kg/m.  Wt Readings from Last 3 Encounters:  04/07/22 146 lb (66.2 kg)  12/26/21 157 lb 13.6 oz (71.6 kg)  12/26/21 157 lb 12.8 oz (71.6 kg)    Objective:  Physical Exam Vitals reviewed.  Constitutional:      General: She is not in acute distress.    Appearance: Normal appearance.  Cardiovascular:     Rate and Rhythm: Normal rate and regular rhythm.     Pulses: Normal pulses.     Heart sounds: Normal heart sounds. No murmur heard. Pulmonary:     Effort: Pulmonary effort is normal. No respiratory  distress.     Breath sounds: Normal breath sounds. No wheezing.  Musculoskeletal:     Cervical back: Normal range of motion and neck supple.  Skin:    General: Skin is warm and dry.     Capillary Refill: Capillary refill takes less than 2 seconds.  Neurological:     General: No focal deficit present.     Mental Status: She is alert and oriented to person, place, and time.     Cranial Nerves: No cranial nerve deficit.     Motor: No weakness.  Psychiatric:        Mood and Affect: Mood normal.        Behavior: Behavior normal.        Thought Content: Thought content normal.        Judgment: Judgment normal.         Assessment And Plan:     1. Hypertensive nephropathy Comments: Blood pressure slightly elevated today will recheck  - Hemoglobin A1c  2. Diabetes mellitus with stage 3 chronic kidney disease (HCC) Comments: HgbA1c is improving and tolerating rybelsus well  - Hemoglobin A1c - BMP8+eGFR  3. Pure hypercholesterolemia Comments: Stable, continue statin  - Lipid panel    Patient was given opportunity to ask questions. Patient verbalized understanding of the plan and was able to repeat key elements of the plan. All questions were answered to their satisfaction.  Kristen Brine, FNP   I, Kristen Brine, FNP, have reviewed all documentation for this visit. The documentation on 04/07/22 for the exam, diagnosis, procedures, and orders are all accurate and complete.   IF YOU HAVE BEEN REFERRED TO A SPECIALIST, IT MAY TAKE 1-2 WEEKS TO SCHEDULE/PROCESS THE REFERRAL. IF YOU HAVE NOT HEARD FROM US/SPECIALIST IN TWO WEEKS, PLEASE GIVE [place] A CALL AT [phone].   THE PATIENT IS ENCOURAGED TO PRACTICE SOCIAL DISTANCING DUE TO THE COVID-19 PANDEMIC.

## 2022-04-08 LAB — BMP8+EGFR
BUN/Creatinine Ratio: 19 (ref 12–28)
BUN: 19 mg/dL (ref 8–27)
CO2: 23 mmol/L (ref 20–29)
Calcium: 10.1 mg/dL (ref 8.7–10.3)
Chloride: 107 mmol/L — ABNORMAL HIGH (ref 96–106)
Creatinine, Ser: 1.02 mg/dL — ABNORMAL HIGH (ref 0.57–1.00)
Glucose: 71 mg/dL (ref 70–99)
Potassium: 3.6 mmol/L (ref 3.5–5.2)
Sodium: 143 mmol/L (ref 134–144)
eGFR: 57 mL/min/{1.73_m2} — ABNORMAL LOW (ref >59)

## 2022-04-08 LAB — LIPID PANEL
Chol/HDL Ratio: 2.3 ratio (ref 0.0–4.4)
Cholesterol, Total: 140 mg/dL (ref 100–199)
HDL: 61 mg/dL (ref >39)
LDL Chol Calc (NIH): 64 mg/dL (ref 0–99)
Triglycerides: 74 mg/dL (ref 0–149)
VLDL Cholesterol Cal: 15 mg/dL (ref 5–40)

## 2022-04-08 LAB — HEMOGLOBIN A1C
Est. average glucose Bld gHb Est-mCnc: 111 mg/dL
Hgb A1c MFr Bld: 5.5 % (ref 4.8–5.6)

## 2022-05-15 DIAGNOSIS — H16142 Punctate keratitis, left eye: Secondary | ICD-10-CM | POA: Diagnosis not present

## 2022-05-15 DIAGNOSIS — H18232 Secondary corneal edema, left eye: Secondary | ICD-10-CM | POA: Diagnosis not present

## 2022-05-19 DIAGNOSIS — H04123 Dry eye syndrome of bilateral lacrimal glands: Secondary | ICD-10-CM | POA: Diagnosis not present

## 2022-05-19 DIAGNOSIS — H16142 Punctate keratitis, left eye: Secondary | ICD-10-CM | POA: Diagnosis not present

## 2022-05-19 DIAGNOSIS — H40013 Open angle with borderline findings, low risk, bilateral: Secondary | ICD-10-CM | POA: Diagnosis not present

## 2022-05-19 DIAGNOSIS — Z961 Presence of intraocular lens: Secondary | ICD-10-CM | POA: Diagnosis not present

## 2022-05-19 DIAGNOSIS — H18232 Secondary corneal edema, left eye: Secondary | ICD-10-CM | POA: Diagnosis not present

## 2022-05-23 DIAGNOSIS — H16142 Punctate keratitis, left eye: Secondary | ICD-10-CM | POA: Diagnosis not present

## 2022-05-23 DIAGNOSIS — E1136 Type 2 diabetes mellitus with diabetic cataract: Secondary | ICD-10-CM | POA: Diagnosis not present

## 2022-05-23 DIAGNOSIS — H26492 Other secondary cataract, left eye: Secondary | ICD-10-CM | POA: Diagnosis not present

## 2022-05-23 DIAGNOSIS — H31009 Unspecified chorioretinal scars, unspecified eye: Secondary | ICD-10-CM | POA: Diagnosis not present

## 2022-05-23 DIAGNOSIS — E119 Type 2 diabetes mellitus without complications: Secondary | ICD-10-CM | POA: Diagnosis not present

## 2022-05-23 DIAGNOSIS — H04123 Dry eye syndrome of bilateral lacrimal glands: Secondary | ICD-10-CM | POA: Diagnosis not present

## 2022-05-23 DIAGNOSIS — H16141 Punctate keratitis, right eye: Secondary | ICD-10-CM | POA: Diagnosis not present

## 2022-05-23 DIAGNOSIS — Z961 Presence of intraocular lens: Secondary | ICD-10-CM | POA: Diagnosis not present

## 2022-05-23 DIAGNOSIS — H18232 Secondary corneal edema, left eye: Secondary | ICD-10-CM | POA: Diagnosis not present

## 2022-05-23 DIAGNOSIS — H40003 Preglaucoma, unspecified, bilateral: Secondary | ICD-10-CM | POA: Diagnosis not present

## 2022-06-09 DIAGNOSIS — H18232 Secondary corneal edema, left eye: Secondary | ICD-10-CM | POA: Diagnosis not present

## 2022-06-09 DIAGNOSIS — H16142 Punctate keratitis, left eye: Secondary | ICD-10-CM | POA: Diagnosis not present

## 2022-06-09 DIAGNOSIS — Z961 Presence of intraocular lens: Secondary | ICD-10-CM | POA: Diagnosis not present

## 2022-06-09 DIAGNOSIS — H40003 Preglaucoma, unspecified, bilateral: Secondary | ICD-10-CM | POA: Diagnosis not present

## 2022-06-09 DIAGNOSIS — H04123 Dry eye syndrome of bilateral lacrimal glands: Secondary | ICD-10-CM | POA: Diagnosis not present

## 2022-06-24 ENCOUNTER — Other Ambulatory Visit: Payer: Self-pay

## 2022-06-24 MED ORDER — GLIPIZIDE 10 MG PO TABS: 10.0000 mg | ORAL_TABLET | Freq: Two times a day (BID) | ORAL | 0 refills | Status: DC

## 2022-06-25 ENCOUNTER — Ambulatory Visit (INDEPENDENT_AMBULATORY_CARE_PROVIDER_SITE_OTHER): Payer: Medicare Other | Admitting: Nurse Practitioner

## 2022-06-25 ENCOUNTER — Encounter: Payer: Self-pay | Admitting: Nurse Practitioner

## 2022-06-25 VITALS — BP 136/80 | HR 74 | Temp 98.6°F | Ht 60.0 in | Wt 144.4 lb

## 2022-06-25 DIAGNOSIS — F329 Major depressive disorder, single episode, unspecified: Secondary | ICD-10-CM | POA: Diagnosis not present

## 2022-06-25 DIAGNOSIS — Z23 Encounter for immunization: Secondary | ICD-10-CM | POA: Diagnosis not present

## 2022-06-25 DIAGNOSIS — N1831 Chronic kidney disease, stage 3a: Secondary | ICD-10-CM | POA: Diagnosis not present

## 2022-06-25 DIAGNOSIS — R8290 Unspecified abnormal findings in urine: Secondary | ICD-10-CM

## 2022-06-25 DIAGNOSIS — E1122 Type 2 diabetes mellitus with diabetic chronic kidney disease: Secondary | ICD-10-CM

## 2022-06-25 LAB — POCT URINALYSIS DIPSTICK
Bilirubin, UA: NEGATIVE
Glucose, UA: NEGATIVE
Ketones, UA: NEGATIVE
Nitrite, UA: POSITIVE
Protein, UA: POSITIVE — AB
Spec Grav, UA: >=1.03 — AB (ref 1.010–1.025)
Urobilinogen, UA: 0.2 E.U./dL
pH, UA: 5.5 (ref 5.0–8.0)

## 2022-06-25 MED ORDER — AMOXICILLIN 500 MG PO TABS: 500.0000 mg | ORAL_TABLET | Freq: Two times a day (BID) | ORAL | 0 refills | Status: DC

## 2022-06-25 NOTE — Patient Instructions (Addendum)

## 2022-06-25 NOTE — Progress Notes (Signed)
Barnet Glasgow Martin,acting as a Education administrator for Minette Brine, FNP.,have documented all relevant documentation on the behalf of Minette Brine, FNP,as directed by  Minette Brine, FNP while in the presence of Minette Brine, [place].    Subjective:     Patient ID: Kristen Cannon , female    DOB: [birth year] , 76 y.o.   MRN: [id number]   Chief Complaint  Patient presents with   Urinary Tract Infection    HPI  Patient thinks she has an UTI, she is having pain when urinating and cloudy urine and a dark yellow.  Symptoms started about one week ago. 3-6 (16 oz bottles of water a day).      Past Medical History:  Diagnosis Date   Diabetes mellitus    High cholesterol    Hypertension      Family History  Problem Relation Age of Onset   Breast cancer Sister    Breast cancer Daughter    Early death Mother    Early death Father      Current Outpatient Medications:    amoxicillin (AMOXIL) 500 MG tablet, Take 1 tablet (500 mg total) by mouth 2 (two) times daily., Disp: 14 tablet, Rfl: 0   aspirin EC 81 MG tablet, Take 81 mg by mouth every morning., Disp: , Rfl:    atorvastatin (LIPITOR) 20 MG tablet, TAKE 1 TABLET(20 MG) BY MOUTH DAILY, Disp: 90 tablet, Rfl: 1   glipiZIDE (GLUCOTROL) 10 MG tablet, Take 1 tablet (10 mg total) by mouth 2 (two) times daily., Disp: 180 tablet, Rfl: 0   linaclotide (LINZESS) 72 MCG capsule, Take 1 capsule (72 mcg total) by mouth daily before breakfast. As needed, Disp: 90 capsule, Rfl: 1   losartan (COZAAR) 100 MG tablet, Take 1 tablet (100 mg total) by mouth daily., Disp: 90 tablet, Rfl: 1   RYBELSUS 7 MG TABS, TAKE 1 TABLET BY MOUTH DAILY 30 MINUTES BEFORE BREAKFAST, Disp: 90 tablet, Rfl: 0   No Known Allergies   Review of Systems  Constitutional: Negative.   Respiratory: Negative.    Cardiovascular: Negative.   Genitourinary:  Positive for dysuria and frequency. Negative for difficulty urinating, pelvic pain and urgency.       Urine odor and dark in color   Psychiatric/Behavioral: Negative.       Today's Vitals   06/25/22 1117  BP: 136/80  Pulse: 74  Temp: 98.6 F (37 C)  TempSrc: Oral  Weight: 144 lb 6.4 oz (65.5 kg)  Height: 5' (1.524 m)  PainSc: 0-No pain   Body mass index is 28.2 kg/m.  Wt Readings from Last 3 Encounters:  06/25/22 144 lb 6.4 oz (65.5 kg)  04/07/22 146 lb (66.2 kg)  12/26/21 157 lb 13.6 oz (71.6 kg)     Objective:  Physical Exam Vitals reviewed.  Constitutional:      General: She is not in acute distress.    Appearance: Normal appearance.  Cardiovascular:     Rate and Rhythm: Normal rate and regular rhythm.     Pulses: Normal pulses.     Heart sounds: Normal heart sounds. No murmur heard. Pulmonary:     Effort: Pulmonary effort is normal. No respiratory distress.     Breath sounds: Normal breath sounds. No wheezing.  Musculoskeletal:     Cervical back: Normal range of motion and neck supple.  Skin:    General: Skin is warm and dry.     Capillary Refill: Capillary refill takes less than 2 seconds.  Neurological:  General: No focal deficit present.     Mental Status: She is alert and oriented to person, place, and time.     Cranial Nerves: No cranial nerve deficit.     Motor: No weakness.  Psychiatric:        Mood and Affect: Mood normal.        Behavior: Behavior normal.        Thought Content: Thought content normal.        Judgment: Judgment normal.         Assessment And Plan:     1. Cloudy urine Comments: Will treat based on symptoms with amoxicillin and encouraged to increase water intake.  - POCT Urinalysis Dipstick (81002) - Culture, Urine - amoxicillin (AMOXIL) 500 MG tablet; Take 1 tablet (500 mg total) by mouth 2 (two) times daily.  Dispense: 14 tablet; Refill: 0  2. Abnormal urine odor - Culture, Urine - amoxicillin (AMOXIL) 500 MG tablet; Take 1 tablet (500 mg total) by mouth 2 (two) times daily.  Dispense: 14 tablet; Refill: 0  3. Type 2 diabetes mellitus with stage  3a chronic kidney disease, unspecified whether long term insulin use (HCC) Comments: Stable, continue current medications  4. Reactive depression Comments: Improved from her last visit, depression screen score is now 3  5. Need for influenza vaccination Influenza vaccine administered Encouraged to take Tylenol as needed for fever or muscle aches. - Flu Vaccine QUAD High Dose(Fluad)     Patient was given opportunity to ask questions. Patient verbalized understanding of the plan and was able to repeat key elements of the plan. All questions were answered to their satisfaction.  Minette Brine, FNP    I, Minette Brine, FNP, have reviewed all documentation for this visit. The documentation on 06/25/22 for the exam, diagnosis, procedures, and orders are all accurate and complete.  IF YOU HAVE BEEN REFERRED TO A SPECIALIST, IT MAY TAKE 1-2 WEEKS TO SCHEDULE/PROCESS THE REFERRAL. IF YOU HAVE NOT HEARD FROM US/SPECIALIST IN TWO WEEKS, PLEASE GIVE [place] A CALL AT [phone].   THE PATIENT IS ENCOURAGED TO PRACTICE SOCIAL DISTANCING DUE TO THE COVID-19 PANDEMIC.

## 2022-06-28 LAB — URINE CULTURE

## 2022-07-04 DIAGNOSIS — H04123 Dry eye syndrome of bilateral lacrimal glands: Secondary | ICD-10-CM | POA: Diagnosis not present

## 2022-07-04 DIAGNOSIS — E119 Type 2 diabetes mellitus without complications: Secondary | ICD-10-CM | POA: Diagnosis not present

## 2022-07-04 DIAGNOSIS — Z7984 Long term (current) use of oral hypoglycemic drugs: Secondary | ICD-10-CM | POA: Diagnosis not present

## 2022-07-04 DIAGNOSIS — H18232 Secondary corneal edema, left eye: Secondary | ICD-10-CM | POA: Diagnosis not present

## 2022-07-04 DIAGNOSIS — Z961 Presence of intraocular lens: Secondary | ICD-10-CM | POA: Diagnosis not present

## 2022-07-04 DIAGNOSIS — H31099 Other chorioretinal scars, unspecified eye: Secondary | ICD-10-CM | POA: Diagnosis not present

## 2022-07-04 DIAGNOSIS — H31009 Unspecified chorioretinal scars, unspecified eye: Secondary | ICD-10-CM | POA: Diagnosis not present

## 2022-07-04 DIAGNOSIS — H40003 Preglaucoma, unspecified, bilateral: Secondary | ICD-10-CM | POA: Diagnosis not present

## 2022-07-04 DIAGNOSIS — H16142 Punctate keratitis, left eye: Secondary | ICD-10-CM | POA: Diagnosis not present

## 2022-07-19 DIAGNOSIS — H18232 Secondary corneal edema, left eye: Secondary | ICD-10-CM | POA: Insufficient documentation

## 2022-07-29 ENCOUNTER — Ambulatory Visit (INDEPENDENT_AMBULATORY_CARE_PROVIDER_SITE_OTHER): Payer: Medicare Other | Admitting: Nurse Practitioner

## 2022-07-29 ENCOUNTER — Encounter: Payer: Self-pay | Admitting: Nurse Practitioner

## 2022-07-29 VITALS — BP 160/108 | HR 85 | Temp 98.3°F | Ht 60.0 in | Wt 146.2 lb

## 2022-07-29 DIAGNOSIS — Z0000 Encounter for general adult medical examination without abnormal findings: Secondary | ICD-10-CM | POA: Diagnosis not present

## 2022-07-29 DIAGNOSIS — R2689 Other abnormalities of gait and mobility: Secondary | ICD-10-CM

## 2022-07-29 DIAGNOSIS — E7800 Pure hypercholesterolemia, unspecified: Secondary | ICD-10-CM | POA: Diagnosis not present

## 2022-07-29 DIAGNOSIS — R0982 Postnasal drip: Secondary | ICD-10-CM | POA: Diagnosis not present

## 2022-07-29 DIAGNOSIS — E1122 Type 2 diabetes mellitus with diabetic chronic kidney disease: Secondary | ICD-10-CM

## 2022-07-29 DIAGNOSIS — E559 Vitamin D deficiency, unspecified: Secondary | ICD-10-CM | POA: Diagnosis not present

## 2022-07-29 DIAGNOSIS — I129 Hypertensive chronic kidney disease with stage 1 through stage 4 chronic kidney disease, or unspecified chronic kidney disease: Secondary | ICD-10-CM

## 2022-07-29 DIAGNOSIS — N1831 Chronic kidney disease, stage 3a: Secondary | ICD-10-CM | POA: Diagnosis not present

## 2022-07-29 DIAGNOSIS — F329 Major depressive disorder, single episode, unspecified: Secondary | ICD-10-CM

## 2022-07-29 DIAGNOSIS — R7309 Other abnormal glucose: Secondary | ICD-10-CM | POA: Diagnosis not present

## 2022-07-29 LAB — POCT URINALYSIS DIPSTICK
Bilirubin, UA: NEGATIVE
Blood, UA: NEGATIVE
Glucose, UA: NEGATIVE
Ketones, UA: NEGATIVE
Leukocytes, UA: NEGATIVE
Nitrite, UA: NEGATIVE
Protein, UA: POSITIVE — AB
Spec Grav, UA: >=1.03 — AB (ref 1.010–1.025)
Urobilinogen, UA: 1 E.U./dL
pH, UA: 6 (ref 5.0–8.0)

## 2022-07-29 MED ORDER — LORATADINE 10 MG PO TABS: 10.0000 mg | ORAL_TABLET | Freq: Every day | ORAL | 2 refills | Status: DC

## 2022-07-29 NOTE — Patient Instructions (Signed)

## 2022-07-29 NOTE — Progress Notes (Signed)
I,Victoria T Hamilton,acting as a Education administrator for Minette Brine, FNP.,have documented all relevant documentation on the behalf of Minette Brine, FNP,as directed by  Minette Brine, FNP while in the presence of Minette Brine, [place].   Subjective:     Patient ID: Kristen Cannon , female    DOB: 1946/05/22 , 76 y.o.   MRN: [id number]   Chief Complaint  Patient presents with   Annual Exam    HPI  Patient here for hm. She is no longer goes to a GYN provider. She has seen an ophthalmologist at [facility] for a left cornea transplant. Reports she will stumble a lot but has not had a fall.   Wt Readings from Last 3 Encounters: 07/29/22 : 146 lb 3.2 oz (66.3 kg) 06/25/22 : 144 lb 6.4 oz (65.5 kg) 04/07/22 : 146 lb (66.2 kg)  She will have mucous on her toothbrush.      Past Medical History:  Diagnosis Date   Diabetes mellitus    High cholesterol    Hypertension      Family History  Problem Relation Age of Onset   Breast cancer Sister    Breast cancer Daughter    Early death Mother    Early death Father      Current Outpatient Medications:    glipiZIDE (GLUCOTROL) 10 MG tablet, Take 1 tablet (10 mg total) by mouth 2 (two) times daily., Disp: 180 tablet, Rfl: 0   linaclotide (LINZESS) 72 MCG capsule, Take 1 capsule (72 mcg total) by mouth daily before breakfast. As needed, Disp: 90 capsule, Rfl: 1   loratadine (CLARITIN) 10 MG tablet, Take 1 tablet (10 mg total) by mouth daily., Disp: 30 tablet, Rfl: 2   losartan (COZAAR) 100 MG tablet, Take 1 tablet (100 mg total) by mouth daily., Disp: 90 tablet, Rfl: 1   RYBELSUS 7 MG TABS, TAKE 1 TABLET BY MOUTH DAILY 30 MINUTES BEFORE BREAKFAST, Disp: 90 tablet, Rfl: 0   amoxicillin (AMOXIL) 500 MG tablet, Take 1 tablet (500 mg total) by mouth 2 (two) times daily. (Patient not taking: Reported on 07/29/2022), Disp: 14 tablet, Rfl: 0   aspirin EC 81 MG tablet, Take 81 mg by mouth every morning. (Patient not taking: Reported on 07/29/2022), Disp: ,  Rfl:    atorvastatin (LIPITOR) 20 MG tablet, TAKE 1 TABLET(20 MG) BY MOUTH DAILY, Disp: 90 tablet, Rfl: 1   No Known Allergies    The patient states she status post hysterectomy.  No LMP recorded. Patient has had a hysterectomy.  Negative for Dysmenorrhea and Negative for Menorrhagia. Negative for: breast discharge, breast lump(s), breast pain and breast self exam. Associated symptoms include abnormal vaginal bleeding. Pertinent negatives include abnormal bleeding (hematology), anxiety, decreased libido, depression, difficulty falling sleep, dyspareunia, history of infertility, nocturia, sexual dysfunction, sleep disturbances, urinary incontinence, urinary urgency, vaginal discharge and vaginal itching. Diet regular. The patient states her exercise level is minimal walking 4 days a week - 5 minutes.   The patient's tobacco use is:  Social History   Tobacco Use  Smoking Status Former   Packs/day: 1.00   Years: 12.00   Total pack years: 12.00   Types: Cigars, Cigarettes   Quit date: 2007   Years since quitting: 16.8  Smokeless Tobacco Never  Tobacco Comments   1 cigar a week   She has been exposed to passive smoke. The patient's alcohol use is:  Social History   Substance and Sexual Activity  Alcohol Use Never     Review  of Systems  Constitutional: Negative.   HENT: Negative.    Eyes: Negative.   Respiratory: Negative.    Cardiovascular: Negative.   Gastrointestinal: Negative.   Endocrine: Negative.   Genitourinary: Negative.   Musculoskeletal: Negative.   Skin: Negative.   Allergic/Immunologic: Negative.   Neurological:  Positive for dizziness.       Has been having episodes of her balance being off regularly  Hematological: Negative.   Psychiatric/Behavioral: Negative.       Today's Vitals   07/29/22 1001 07/29/22 1112  BP: (!) 140/98 (!) 160/108  Pulse: 85   Temp: 98.3 F (36.8 C)   SpO2: 98%   Weight: 146 lb 3.2 oz (66.3 kg)   Height: 5' (1.524 m)   PainSc:  0-No pain    Body mass index is 28.55 kg/m. Wt Readings from Last 3 Encounters:  07/29/22 146 lb 3.2 oz (66.3 kg)  06/25/22 144 lb 6.4 oz (65.5 kg)  04/07/22 146 lb (66.2 kg)    Objective:  Physical Exam Vitals reviewed.  Constitutional:      General: She is not in acute distress.    Appearance: Normal appearance. She is well-developed.  HENT:     Head: Normocephalic and atraumatic.     Right Ear: Hearing, tympanic membrane, ear canal and external ear normal. There is no impacted cerumen.     Left Ear: Hearing, tympanic membrane, ear canal and external ear normal. There is no impacted cerumen.     Nose:     Comments: Deferred - masked    Mouth/Throat:     Comments: Deferred - masked  Eyes:     General: Lids are normal.     Extraocular Movements: Extraocular movements intact.     Conjunctiva/sclera: Conjunctivae normal.     Pupils: Pupils are equal, round, and reactive to light.     Funduscopic exam:    Right eye: No papilledema.        Left eye: No papilledema.  Neck:     Thyroid: No thyroid mass.     Vascular: No carotid bruit.  Cardiovascular:     Rate and Rhythm: Normal rate and regular rhythm.     Pulses: Normal pulses.     Heart sounds: Normal heart sounds. No murmur heard. Pulmonary:     Effort: Pulmonary effort is normal.     Breath sounds: Normal breath sounds.  Chest:     Chest wall: No mass.  Breasts:    Tanner Score is 5.     Breasts are symmetrical.     Right: Normal. No tenderness.     Left: No tenderness.  Abdominal:     General: Abdomen is flat. Bowel sounds are normal. There is no distension.     Palpations: Abdomen is soft.     Tenderness: There is no abdominal tenderness.  Genitourinary:    Rectum: Guaiac result negative.  Musculoskeletal:        General: No swelling. Normal range of motion.     Cervical back: Full passive range of motion without pain, normal range of motion and neck supple.     Right lower leg: No edema.     Left lower leg:  No edema.  Lymphadenopathy:     Upper Body:     Right upper body: No supraclavicular or axillary adenopathy.     Left upper body: No supraclavicular or axillary adenopathy.  Skin:    General: Skin is warm and dry.     Capillary Refill: Capillary  refill takes less than 2 seconds.  Neurological:     General: No focal deficit present.     Mental Status: She is alert and oriented to person, place, and time.     Cranial Nerves: No cranial nerve deficit.     Sensory: No sensory deficit.     Motor: No weakness.  Psychiatric:        Mood and Affect: Mood normal.        Behavior: Behavior normal.        Thought Content: Thought content normal.        Judgment: Judgment normal.         Assessment And Plan:     1. Health maintenance examination Behavior modifications discussed and diet history reviewed.   Pt will continue to exercise regularly and modify diet with low GI, plant based foods and decrease intake of processed foods.  Recommend intake of daily multivitamin, Vitamin D, and calcium.  Recommend mammogram for preventive screenings, as well as recommend immunizations that include influenza, TDAP, and Shingles  2. Type 2 diabetes mellitus with stage 3a chronic kidney disease, without long-term current use of insulin (HCC) Comments: HgbA1c is improved, I will decrease her dose of Rybelsus she is having balance issues to make sure this is not related to low blood sugar  3. Hypertensive nephropathy Comments: Blood pressure is elevated today, she has not taken her medications. Return for NV. EKG done no changes. - POCT Urinalysis Dipstick (81002) - Microalbumin / creatinine urine ratio - EKG 12-Lead  4. Balance problem Comments: Will check CT scan brain and refer for PT - CMP14+EGFR - Hemoglobin A1c - Lipid panel - Ambulatory referral to Physical Therapy - CT HEAD WO CONTRAST (5MM); Future  5. Reactive depression  6. Vitamin D deficiency Will check vitamin D level and  supplement as needed.    Also encouraged to spend 15 minutes in the sun daily.   7. Pure hypercholesterolemia Comments: Continue current medications. Encouraged to eat a low fat diet.  - CBC  8. Post-nasal drainage Comments: Will treat with loratadine    Patient was given opportunity to ask questions. Patient verbalized understanding of the plan and was able to repeat key elements of the plan. All questions were answered to their satisfaction.   Minette Brine, FNP   I, Minette Brine, FNP, have reviewed all documentation for this visit. The documentation on 07/29/22 for the exam, diagnosis, procedures, and orders are all accurate and complete.   THE PATIENT IS ENCOURAGED TO PRACTICE SOCIAL DISTANCING DUE TO THE COVID-19 PANDEMIC.

## 2022-07-30 LAB — CBC
Hematocrit: 37.8 % (ref 34.0–46.6)
Hemoglobin: 12.1 g/dL (ref 11.1–15.9)
MCH: 27.4 pg (ref 26.6–33.0)
MCHC: 32 g/dL (ref 31.5–35.7)
MCV: 86 fL (ref 79–97)
Platelets: 293 10*3/uL (ref 150–450)
RBC: 4.42 x10E6/uL (ref 3.77–5.28)
RDW: 12.9 % (ref 11.7–15.4)
WBC: 5 10*3/uL (ref 3.4–10.8)

## 2022-07-30 LAB — MICROALBUMIN / CREATININE URINE RATIO
Creatinine, Urine: 120.9 mg/dL
Microalb/Creat Ratio: 143 mg/g creat — ABNORMAL HIGH (ref 0–29)
Microalbumin, Urine: 172.9 ug/mL

## 2022-07-30 LAB — CMP14+EGFR
ALT: 24 IU/L (ref 0–32)
AST: 29 IU/L (ref 0–40)
Albumin/Globulin Ratio: 1.5 (ref 1.2–2.2)
Albumin: 4.3 g/dL (ref 3.8–4.8)
Alkaline Phosphatase: 112 IU/L (ref 44–121)
BUN/Creatinine Ratio: 22 (ref 12–28)
BUN: 24 mg/dL (ref 8–27)
Bilirubin Total: 0.3 mg/dL (ref 0.0–1.2)
CO2: 26 mmol/L (ref 20–29)
Calcium: 10.2 mg/dL (ref 8.7–10.3)
Chloride: 104 mmol/L (ref 96–106)
Creatinine, Ser: 1.07 mg/dL — ABNORMAL HIGH (ref 0.57–1.00)
Globulin, Total: 2.9 g/dL (ref 1.5–4.5)
Glucose: 72 mg/dL (ref 70–99)
Potassium: 4 mmol/L (ref 3.5–5.2)
Sodium: 143 mmol/L (ref 134–144)
Total Protein: 7.2 g/dL (ref 6.0–8.5)
eGFR: 53 mL/min/{1.73_m2} — ABNORMAL LOW (ref >59)

## 2022-07-30 LAB — HEMOGLOBIN A1C
Est. average glucose Bld gHb Est-mCnc: 151 mg/dL
Hgb A1c MFr Bld: 6.9 % — ABNORMAL HIGH (ref 4.8–5.6)

## 2022-07-30 LAB — LIPID PANEL
Chol/HDL Ratio: 1.8 ratio (ref 0.0–4.4)
Cholesterol, Total: 149 mg/dL (ref 100–199)
HDL: 82 mg/dL (ref >39)
LDL Chol Calc (NIH): 54 mg/dL (ref 0–99)
Triglycerides: 64 mg/dL (ref 0–149)
VLDL Cholesterol Cal: 13 mg/dL (ref 5–40)

## 2022-08-07 ENCOUNTER — Other Ambulatory Visit: Payer: Self-pay | Admitting: Nurse Practitioner

## 2022-08-07 DIAGNOSIS — E7800 Pure hypercholesterolemia, unspecified: Secondary | ICD-10-CM

## 2022-09-10 ENCOUNTER — Encounter: Payer: Self-pay | Admitting: Nurse Practitioner

## 2022-09-10 ENCOUNTER — Ambulatory Visit (INDEPENDENT_AMBULATORY_CARE_PROVIDER_SITE_OTHER): Payer: Medicare Other | Admitting: Nurse Practitioner

## 2022-09-10 VITALS — BP 160/120 | HR 88 | Temp 98.3°F | Ht 60.0 in | Wt 147.8 lb

## 2022-09-10 DIAGNOSIS — E1122 Type 2 diabetes mellitus with diabetic chronic kidney disease: Secondary | ICD-10-CM | POA: Diagnosis not present

## 2022-09-10 DIAGNOSIS — F329 Major depressive disorder, single episode, unspecified: Secondary | ICD-10-CM

## 2022-09-10 DIAGNOSIS — K5900 Constipation, unspecified: Secondary | ICD-10-CM

## 2022-09-10 DIAGNOSIS — M25473 Effusion, unspecified ankle: Secondary | ICD-10-CM

## 2022-09-10 DIAGNOSIS — I129 Hypertensive chronic kidney disease with stage 1 through stage 4 chronic kidney disease, or unspecified chronic kidney disease: Secondary | ICD-10-CM | POA: Diagnosis not present

## 2022-09-10 DIAGNOSIS — R8290 Unspecified abnormal findings in urine: Secondary | ICD-10-CM

## 2022-09-10 DIAGNOSIS — N1831 Chronic kidney disease, stage 3a: Secondary | ICD-10-CM | POA: Diagnosis not present

## 2022-09-10 DIAGNOSIS — E7800 Pure hypercholesterolemia, unspecified: Secondary | ICD-10-CM

## 2022-09-10 DIAGNOSIS — E559 Vitamin D deficiency, unspecified: Secondary | ICD-10-CM

## 2022-09-10 MED ORDER — OLMESARTAN MEDOXOMIL 20 MG PO TABS: 20.0000 mg | ORAL_TABLET | Freq: Every day | ORAL | 1 refills | Status: DC

## 2022-09-10 MED ORDER — GLIPIZIDE 10 MG PO TABS: 10.0000 mg | ORAL_TABLET | Freq: Two times a day (BID) | ORAL | 1 refills | Status: DC

## 2022-09-10 NOTE — Patient Instructions (Addendum)
Edema  Edema is when you have too much fluid in your body or under your skin. Edema may make your legs, feet, and ankles swell. Swelling often happens in looser tissues, such as around your eyes. This is a common condition. It gets more common as you get older. There are many possible causes of edema. These include: Eating too much salt (sodium). Being on your feet or sitting for a long time. Certain medical conditions, such as: Pregnancy. Heart failure. Liver disease. Kidney disease. Cancer. Hot weather may make edema worse. Edema is usually painless. Your skin may look swollen or shiny. Follow these instructions at home: Medicines Take over-the-counter and prescription medicines only as told by your doctor. Your doctor may prescribe a medicine to help your body get rid of extra water (diuretic). Take this medicine if you are told to take it. Eating and drinking Eat a low-salt (low-sodium) diet as told by your doctor. Sometimes, eating less salt may reduce swelling. Depending on the cause of your swelling, you may need to limit how much fluid you drink (fluid restriction). General instructions Raise the injured area above the level of your heart while you are sitting or lying down. Do not sit still or stand for a long time. Do not wear tight clothes. Do not wear garters on your upper legs. Exercise your legs. This can help the swelling go down. Wear compression stockings as told by your doctor. It is important that these are the right size. These should be prescribed by your doctor to prevent possible injuries. If elastic bandages or wraps are recommended, use them as told by your doctor. Contact a doctor if: Treatment is not working. You have heart, liver, or kidney disease and have symptoms of edema. You have sudden and unexplained weight gain. Get help right away if: You have shortness of breath or chest pain. You cannot breathe when you lie down. You have pain, redness, or  warmth in the swollen areas. You have heart, liver, or kidney disease and get edema all of a sudden. You have a fever and your symptoms get worse all of a sudden. These symptoms may be an emergency. Get help right away. Call 911. Do not wait to see if the symptoms will go away. Do not drive yourself to the hospital. Summary Edema is when you have too much fluid in your body or under your skin. Edema may make your legs, feet, and ankles swell. Swelling often happens in looser tissues, such as around your eyes. Raise the injured area above the level of your heart while you are sitting or lying down. Follow your doctor's instructions about diet and how much fluid you can drink. This information is not intended to replace advice given to you by your health care provider. Make sure you discuss any questions you have with your health care provider. Document Revised: 06/03/2021 Document Reviewed: 06/03/2021 Elsevier Patient Education  [place].  Stop Losartan and pick up olmesartan 20 mg first dose tonight and again tomorrow morning.  Start your 3 mg of Rybelsus on Thursday morning 30 minutes before breakfast.

## 2022-09-10 NOTE — Progress Notes (Signed)
I,Victoria T Hamilton,acting as a Education administrator for Minette Brine, FNP.,have documented all relevant documentation on the behalf of Minette Brine, FNP,as directed by  Minette Brine, FNP while in the presence of Minette Brine, [place].    Subjective:     Patient ID: Kristen Cannon , female    DOB: 09/01/1946 , 76 y.o.   MRN: [id number]   Chief Complaint  Patient presents with   Joint Swelling    HPI  Patient presents today with swelling concerns in ankles.  She adds this has been an ongoing issue for about a month or longer.  Initially noticed 2 weeks ago.   She states spiritually she has put her health in Gods hands, not taking none of her medications since before her last visit.   Wt Readings from Last 3 Encounters: 09/10/22 : 147 lb 12.8 oz (67 kg) 07/29/22 : 146 lb 3.2 oz (66.3 kg) 06/25/22 : 144 lb 6.4 oz (65.5 kg)      Past Medical History:  Diagnosis Date   Diabetes mellitus    High cholesterol    Hypertension      Family History  Problem Relation Age of Onset   Breast cancer Sister    Breast cancer Daughter    Early death Mother    Early death Father      Current Outpatient Medications:    olmesartan (BENICAR) 20 MG tablet, Take 1 tablet (20 mg total) by mouth daily., Disp: 90 tablet, Rfl: 1   amLODipine (NORVASC) 10 MG tablet, Take 1 tablet (10 mg total) by mouth daily., Disp: 30 tablet, Rfl: 1   aspirin EC 81 MG tablet, Take 81 mg by mouth every morning., Disp: , Rfl:    atorvastatin (LIPITOR) 20 MG tablet, TAKE 1 TABLET(20 MG) BY MOUTH DAILY (Patient not taking: Reported on 09/10/2022), Disp: 90 tablet, Rfl: 1   glipiZIDE (GLUCOTROL) 10 MG tablet, TAKE 1 TABLET(10 MG) BY MOUTH TWICE DAILY, Disp: 180 tablet, Rfl: 1   loratadine (CLARITIN) 10 MG tablet, Take 1 tablet (10 mg total) by mouth daily. (Patient not taking: Reported on 09/10/2022), Disp: 30 tablet, Rfl: 2   RYBELSUS 7 MG TABS, TAKE 1 TABLET BY MOUTH DAILY 30 MINUTES BEFORE BREAKFAST (Patient not  taking: Reported on 09/10/2022), Disp: 90 tablet, Rfl: 0   No Known Allergies   Review of Systems  Constitutional: Negative.   Respiratory: Negative.    Cardiovascular: Negative.   Neurological: Negative.   Psychiatric/Behavioral: Negative.       Today's Vitals   09/10/22 1618  BP: (!) 160/120  Pulse: 88  Temp: 98.3 F (36.8 C)  SpO2: 96%  Weight: 147 lb 12.8 oz (67 kg)  Height: 5' (1.524 m)   Body mass index is 28.87 kg/m.  Wt Readings from Last 3 Encounters:  09/18/22 147 lb (66.7 kg)  09/10/22 147 lb 12.8 oz (67 kg)  07/29/22 146 lb 3.2 oz (66.3 kg)    Objective:  Physical Exam Vitals reviewed.  Constitutional:      General: She is not in acute distress.    Appearance: Normal appearance.  Cardiovascular:     Rate and Rhythm: Normal rate and regular rhythm.     Pulses: Normal pulses.     Heart sounds: Normal heart sounds. No murmur heard. Pulmonary:     Effort: Pulmonary effort is normal. No respiratory distress.     Breath sounds: Normal breath sounds. No wheezing.  Musculoskeletal:     Cervical back: Normal range of motion and  neck supple.  Skin:    General: Skin is warm and dry.     Capillary Refill: Capillary refill takes less than 2 seconds.  Neurological:     General: No focal deficit present.     Mental Status: She is alert and oriented to person, place, and time.     Cranial Nerves: No cranial nerve deficit.     Motor: No weakness.  Psychiatric:        Mood and Affect: Mood normal.        Behavior: Behavior normal.        Thought Content: Thought content normal.        Judgment: Judgment normal.         Assessment And Plan:     1. Ankle swelling, unspecified laterality Comments: Trace non pitting edema, encouraged to limit intake of salt and keep elevated when possible.  2. Type 2 diabetes mellitus with stage 3a chronic kidney disease, without long-term current use of insulin (HCC) Comments: HgbA1c is improving, continue current  medications.  3. Hypertensive nephropathy Comments: Blood pressure is elevated today, she had stopped taking her medications, encouraged to take her medications as directed. - olmesartan (BENICAR) 20 MG tablet; Take 1 tablet (20 mg total) by mouth daily.  Dispense: 90 tablet; Refill: 1  4. Reactive depression Comments: Stable.  5. Pure hypercholesterolemia Comments: She has not been taking her medication, advised to restart medications to avoid cardiac event  6. Constipation, unspecified constipation type Comments: Encouraged to increase water intake and eat a high fiber diet. She is also encouraged due to taking rybelsus to take a stool softner     Patient was given opportunity to ask questions. Patient verbalized understanding of the plan and was able to repeat key elements of the plan. All questions were answered to their satisfaction.  Minette Brine, FNP   I, Minette Brine, FNP, have reviewed all documentation for this visit. The documentation on 09/10/22 for the exam, diagnosis, procedures, and orders are all accurate and complete.   IF YOU HAVE BEEN REFERRED TO A SPECIALIST, IT MAY TAKE 1-2 WEEKS TO SCHEDULE/PROCESS THE REFERRAL. IF YOU HAVE NOT HEARD FROM US/SPECIALIST IN TWO WEEKS, PLEASE GIVE [place] A CALL AT [phone] X [phone].   THE PATIENT IS ENCOURAGED TO PRACTICE SOCIAL DISTANCING DUE TO THE COVID-19 PANDEMIC.

## 2022-09-11 ENCOUNTER — Encounter: Payer: Medicare Other | Admitting: Nurse Practitioner

## 2022-09-11 NOTE — Progress Notes (Signed)
Pt presents for bpc. She takes Olmesartan 20MG ,   I have provided oversight concerning  ,CMA evaluation and treatment of this patient's health issues addressed during today's encounter. I agree with the assessment and plan as outlined in the note above.   Arnette Felts, DNP, FNP-BC [facility]

## 2022-09-18 ENCOUNTER — Ambulatory Visit: Payer: Medicare Other

## 2022-09-18 VITALS — BP 140/98 | HR 81 | Temp 98.1°F | Ht 60.0 in | Wt 147.0 lb

## 2022-09-18 DIAGNOSIS — I129 Hypertensive chronic kidney disease with stage 1 through stage 4 chronic kidney disease, or unspecified chronic kidney disease: Secondary | ICD-10-CM

## 2022-09-18 MED ORDER — AMLODIPINE BESYLATE 10 MG PO TABS: 10.0000 mg | ORAL_TABLET | Freq: Every day | ORAL | 1 refills | Status: DC

## 2022-09-18 NOTE — Progress Notes (Signed)
Pt presents today for bpc. She takes Olmesartan '20MG'$  in the morning & Amlodipine '5MG'$  at night. She did take medication before coming in today. She drinks 3-4 bottles of water today. She does not exercise regularly. She has not ate yet today.  BP Readings from Last 3 Encounters:  09/18/22 (!) 140/98  09/10/22 (!) 160/120  07/29/22 (!) 160/108  Will take her bp again in 10 mins. Provider increased amlodipine to '10MG'$ . Patient has follow up appointment. Medication sent to pharmacy.

## 2022-09-18 NOTE — Patient Instructions (Signed)
Hypertension, Adult ?Hypertension is another name for high blood pressure. High blood pressure forces your heart to work harder to pump blood. This can cause problems over time. ?There are two numbers in a blood pressure reading. There is a top number (systolic) over a bottom number (diastolic). It is best to have a blood pressure that is below 120/80. ?What are the causes? ?The cause of this condition is not known. Some other conditions can lead to high blood pressure. ?What increases the risk? ?Some lifestyle factors can make you more likely to develop high blood pressure: ?Smoking. ?Not getting enough exercise or physical activity. ?Being overweight. ?Having too much fat, sugar, calories, or salt (sodium) in your diet. ?Drinking too much alcohol. ?Other risk factors include: ?Having any of these conditions: ?Heart disease. ?Diabetes. ?High cholesterol. ?Kidney disease. ?Obstructive sleep apnea. ?Having a family history of high blood pressure and high cholesterol. ?Age. The risk increases with age. ?Stress. ?What are the signs or symptoms? ?High blood pressure may not cause symptoms. Very high blood pressure (hypertensive crisis) may cause: ?Headache. ?Fast or uneven heartbeats (palpitations). ?Shortness of breath. ?Nosebleed. ?Vomiting or feeling like you may vomit (nauseous). ?Changes in how you see. ?Very bad chest pain. ?Feeling dizzy. ?Seizures. ?How is this treated? ?This condition is treated by making healthy lifestyle changes, such as: ?Eating healthy foods. ?Exercising more. ?Drinking less alcohol. ?Your doctor may prescribe medicine if lifestyle changes do not help enough and if: ?Your top number is above 130. ?Your bottom number is above 80. ?Your personal target blood pressure may vary. ?Follow these instructions at home: ?Eating and drinking ? ?If told, follow the DASH eating plan. To follow this plan: ?Fill one half of your plate at each meal with fruits and vegetables. ?Fill one fourth of your plate  at each meal with whole grains. Whole grains include whole-wheat pasta, brown rice, and whole-grain bread. ?Eat or drink low-fat dairy products, such as skim milk or low-fat yogurt. ?Fill one fourth of your plate at each meal with low-fat (lean) proteins. Low-fat proteins include fish, chicken without skin, eggs, beans, and tofu. ?Avoid fatty meat, cured and processed meat, or chicken with skin. ?Avoid pre-made or processed food. ?Limit the amount of salt in your diet to less than 1,500 mg each day. ?Do not drink alcohol if: ?Your doctor tells you not to drink. ?You are pregnant, may be pregnant, or are planning to become pregnant. ?If you drink alcohol: ?Limit how much you have to: ?0-1 drink a day for women. ?0-2 drinks a day for men. ?Know how much alcohol is in your drink. In the [place] one drink equals one 12 oz bottle of beer (355 mL), one 5 oz glass of wine (148 mL), or one 1? oz glass of hard liquor (44 mL). ?Lifestyle ? ?Work with your doctor to stay at a healthy weight or to lose weight. Ask your doctor what the best weight is for you. ?Get at least 30 minutes of exercise that causes your heart to beat faster (aerobic exercise) most days of the week. This may include walking, swimming, or biking. ?Get at least 30 minutes of exercise that strengthens your muscles (resistance exercise) at least 3 days a week. This may include lifting weights or doing Pilates. ?Do not smoke or use any products that contain nicotine or tobacco. If you need help quitting, ask your doctor. ?Check your blood pressure at home as told by your doctor. ?Keep all follow-up visits. ?Medicines ?Take over-the-counter and prescription medicines   only as told by your doctor. Follow directions carefully. ?Do not skip doses of blood pressure medicine. The medicine does not work as well if you skip doses. Skipping doses also puts you at risk for problems. ?Ask your doctor about side effects or reactions to medicines that you should watch  for. ?Contact a doctor if: ?You think you are having a reaction to the medicine you are taking. ?You have headaches that keep coming back. ?You feel dizzy. ?You have swelling in your ankles. ?You have trouble with your vision. ?Get help right away if: ?You get a very bad headache. ?You start to feel mixed up (confused). ?You feel weak or numb. ?You feel faint. ?You have very bad pain in your: ?Chest. ?Belly (abdomen). ?You vomit more than once. ?You have trouble breathing. ?These symptoms may be an emergency. Get help right away. Call 911. ?Do not wait to see if the symptoms will go away. ?Do not drive yourself to the hospital. ?Summary ?Hypertension is another name for high blood pressure. ?High blood pressure forces your heart to work harder to pump blood. ?For most people, a normal blood pressure is less than 120/80. ?Making healthy choices can help lower blood pressure. If your blood pressure does not get lower with healthy choices, you may need to take medicine. ?This information is not intended to replace advice given to you by your health care provider. Make sure you discuss any questions you have with your health care provider. ?Document Revised: 07/18/2021 Document Reviewed: 07/18/2021 ?Elsevier Patient Education ? 2023 Elsevier Inc. ? ?

## 2022-09-19 ENCOUNTER — Other Ambulatory Visit: Payer: Self-pay

## 2022-09-19 MED ORDER — AMLODIPINE BESYLATE 10 MG PO TABS: 10.0000 mg | ORAL_TABLET | Freq: Every day | ORAL | 1 refills | Status: DC

## 2022-09-20 ENCOUNTER — Other Ambulatory Visit: Payer: Self-pay | Admitting: Nurse Practitioner

## 2022-09-20 DIAGNOSIS — E1122 Type 2 diabetes mellitus with diabetic chronic kidney disease: Secondary | ICD-10-CM

## 2022-09-30 ENCOUNTER — Ambulatory Visit: Payer: Medicare Other

## 2022-09-30 VITALS — BP 136/78 | HR 73 | Temp 98.2°F

## 2022-09-30 DIAGNOSIS — I129 Hypertensive chronic kidney disease with stage 1 through stage 4 chronic kidney disease, or unspecified chronic kidney disease: Secondary | ICD-10-CM

## 2022-09-30 NOTE — Patient Instructions (Signed)
Hypertension, Adult High blood pressure (hypertension) is when the force of blood pumping through the arteries is too strong. The arteries are the blood vessels that carry blood from the heart throughout the body. Hypertension forces the heart to work harder to pump blood and may cause arteries to become narrow or stiff. Untreated or uncontrolled hypertension can lead to a heart attack, heart failure, a stroke, kidney disease, and other problems. A blood pressure reading consists of a higher number over a lower number. Ideally, your blood pressure should be below 120/80. The first ("top") number is called the systolic pressure. It is a measure of the pressure in your arteries as your heart beats. The second ("bottom") number is called the diastolic pressure. It is a measure of the pressure in your arteries as the heart relaxes. What are the causes? The exact cause of this condition is not known. There are some conditions that result in high blood pressure. What increases the risk? Certain factors may make you more likely to develop high blood pressure. Some of these risk factors are under your control, including: Smoking. Not getting enough exercise or physical activity. Being overweight. Having too much fat, sugar, calories, or salt (sodium) in your diet. Drinking too much alcohol. Other risk factors include: Having a personal history of heart disease, diabetes, high cholesterol, or kidney disease. Stress. Having a family history of high blood pressure and high cholesterol. Having obstructive sleep apnea. Age. The risk increases with age. What are the signs or symptoms? High blood pressure may not cause symptoms. Very high blood pressure (hypertensive crisis) may cause: Headache. Fast or irregular heartbeats (palpitations). Shortness of breath. Nosebleed. Nausea and vomiting. Vision changes. Severe chest pain, dizziness, and seizures. How is this diagnosed? This condition is diagnosed by  measuring your blood pressure while you are seated, with your arm resting on a flat surface, your legs uncrossed, and your feet flat on the floor. The cuff of the blood pressure monitor will be placed directly against the skin of your upper arm at the level of your heart. Blood pressure should be measured at least twice using the same arm. Certain conditions can cause a difference in blood pressure between your right and left arms. If you have a high blood pressure reading during one visit or you have normal blood pressure with other risk factors, you may be asked to: Return on a different day to have your blood pressure checked again. Monitor your blood pressure at home for 1 week or longer. If you are diagnosed with hypertension, you may have other blood or imaging tests to help your health care provider understand your overall risk for other conditions. How is this treated? This condition is treated by making healthy lifestyle changes, such as eating healthy foods, exercising more, and reducing your alcohol intake. You may be referred for counseling on a healthy diet and physical activity. Your health care provider may prescribe medicine if lifestyle changes are not enough to get your blood pressure under control and if: Your systolic blood pressure is above 130. Your diastolic blood pressure is above 80. Your personal target blood pressure may vary depending on your medical conditions, your age, and other factors. Follow these instructions at home: Eating and drinking  Eat a diet that is high in fiber and potassium, and low in sodium, added sugar, and fat. An example of this eating plan is called the DASH diet. DASH stands for Dietary Approaches to Stop Hypertension. To eat this way: Eat   plenty of fresh fruits and vegetables. Try to fill one half of your plate at each meal with fruits and vegetables. Eat whole grains, such as whole-wheat pasta, brown rice, or whole-grain bread. Fill about one  fourth of your plate with whole grains. Eat or drink low-fat dairy products, such as skim milk or low-fat yogurt. Avoid fatty cuts of meat, processed or cured meats, and poultry with skin. Fill about one fourth of your plate with lean proteins, such as fish, chicken without skin, beans, eggs, or tofu. Avoid pre-made and processed foods. These tend to be higher in sodium, added sugar, and fat. Reduce your daily sodium intake. Many people with hypertension should eat less than 1,500 mg of sodium a day. Do not drink alcohol if: Your health care provider tells you not to drink. You are pregnant, may be pregnant, or are planning to become pregnant. If you drink alcohol: Limit how much you have to: 0-1 drink a day for women. 0-2 drinks a day for men. Know how much alcohol is in your drink. In the [place] one drink equals one 12 oz bottle of beer (355 mL), one 5 oz glass of wine (148 mL), or one 1 oz glass of hard liquor (44 mL). Lifestyle  Work with your health care provider to maintain a healthy body weight or to lose weight. Ask what an ideal weight is for you. Get at least 30 minutes of exercise that causes your heart to beat faster (aerobic exercise) most days of the week. Activities may include walking, swimming, or biking. Include exercise to strengthen your muscles (resistance exercise), such as Pilates or lifting weights, as part of your weekly exercise routine. Try to do these types of exercises for 30 minutes at least 3 days a week. Do not use any products that contain nicotine or tobacco. These products include cigarettes, chewing tobacco, and vaping devices, such as e-cigarettes. If you need help quitting, ask your health care provider. Monitor your blood pressure at home as told by your health care provider. Keep all follow-up visits. This is important. Medicines Take over-the-counter and prescription medicines only as told by your health care provider. Follow directions carefully. Blood  pressure medicines must be taken as prescribed. Do not skip doses of blood pressure medicine. Doing this puts you at risk for problems and can make the medicine less effective. Ask your health care provider about side effects or reactions to medicines that you should watch for. Contact a health care provider if you: Think you are having a reaction to a medicine you are taking. Have headaches that keep coming back (recurring). Feel dizzy. Have swelling in your ankles. Have trouble with your vision. Get help right away if you: Develop a severe headache or confusion. Have unusual weakness or numbness. Feel faint. Have severe pain in your chest or abdomen. Vomit repeatedly. Have trouble breathing. These symptoms may be an emergency. Get help right away. Call 911. Do not wait to see if the symptoms will go away. Do not drive yourself to the hospital. Summary Hypertension is when the force of blood pumping through your arteries is too strong. If this condition is not controlled, it may put you at risk for serious complications. Your personal target blood pressure may vary depending on your medical conditions, your age, and other factors. For most people, a normal blood pressure is less than 120/80. Hypertension is treated with lifestyle changes, medicines, or a combination of both. Lifestyle changes include losing weight, eating a healthy,   low-sodium diet, exercising more, and limiting alcohol. This information is not intended to replace advice given to you by your health care provider. Make sure you discuss any questions you have with your health care provider. Document Revised: 08/06/2021 Document Reviewed: 08/06/2021 Elsevier Patient Education  2023 Elsevier Inc.  

## 2022-09-30 NOTE — Progress Notes (Signed)
Patient presents today for BPC. She is currently taking Amlodipine '10mg'$  and Olmesartan '20mg'$ .   BP Readings from Last 3 Encounters:  09/30/22 136/78  09/18/22 (!) 140/98  09/10/22 (!) 160/120   Patient will continue with current medications and follow up as scheduled with provider.

## 2022-10-24 DIAGNOSIS — H18232 Secondary corneal edema, left eye: Secondary | ICD-10-CM | POA: Diagnosis not present

## 2022-10-28 ENCOUNTER — Ambulatory Visit: Payer: Medicare Other | Admitting: Internal Medicine

## 2022-11-04 ENCOUNTER — Encounter: Payer: Self-pay | Admitting: Nurse Practitioner

## 2022-11-04 ENCOUNTER — Ambulatory Visit (INDEPENDENT_AMBULATORY_CARE_PROVIDER_SITE_OTHER): Payer: 59 | Admitting: Nurse Practitioner

## 2022-11-04 VITALS — BP 142/92 | HR 81 | Temp 98.1°F | Ht 60.0 in | Wt 151.4 lb

## 2022-11-04 DIAGNOSIS — M25473 Effusion, unspecified ankle: Secondary | ICD-10-CM

## 2022-11-04 DIAGNOSIS — Z79899 Other long term (current) drug therapy: Secondary | ICD-10-CM

## 2022-11-04 DIAGNOSIS — M25461 Effusion, right knee: Secondary | ICD-10-CM | POA: Diagnosis not present

## 2022-11-04 DIAGNOSIS — M25462 Effusion, left knee: Secondary | ICD-10-CM | POA: Diagnosis not present

## 2022-11-04 DIAGNOSIS — I129 Hypertensive chronic kidney disease with stage 1 through stage 4 chronic kidney disease, or unspecified chronic kidney disease: Secondary | ICD-10-CM

## 2022-11-04 DIAGNOSIS — N1831 Chronic kidney disease, stage 3a: Secondary | ICD-10-CM

## 2022-11-04 DIAGNOSIS — E7800 Pure hypercholesterolemia, unspecified: Secondary | ICD-10-CM | POA: Diagnosis not present

## 2022-11-04 DIAGNOSIS — E1122 Type 2 diabetes mellitus with diabetic chronic kidney disease: Secondary | ICD-10-CM

## 2022-11-04 NOTE — Progress Notes (Signed)
I,Victoria T Hamilton,acting as a Education administrator for Minette Brine, FNP.,have documented all relevant documentation on the behalf of Minette Brine, FNP,as directed by  Minette Brine, FNP while in the presence of Minette Brine, [place].    Subjective:     Patient ID: Kristen Cannon , female    DOB: [birth year] , 77 y.o.   MRN: [id number]   Chief Complaint  Patient presents with   Joint Swelling    HPI  Pt presents today complaining of swelling. She notices on both ankles & knee. She does keep her legs elevated at home. Reports no pain with the swelling. She did eat a hot dog and onion rings before her visit today. She does eat frozen pizza twice a month. Her swelling stays the same morning and night time. Denies family history of rheumatoid arthritis or lupus. She feels like her neck is more swelling than previous. Denies shortness of breast     Past Medical History:  Diagnosis Date   Diabetes mellitus    High cholesterol    Hypertension      Family History  Problem Relation Age of Onset   Breast cancer Sister    Breast cancer Daughter    Early death Mother    Early death Father      Current Outpatient Medications:    amLODipine (NORVASC) 10 MG tablet, Take 1 tablet (10 mg total) by mouth daily., Disp: 30 tablet, Rfl: 1   aspirin EC 81 MG tablet, Take 81 mg by mouth every morning., Disp: , Rfl:    glipiZIDE (GLUCOTROL) 10 MG tablet, TAKE 1 TABLET(10 MG) BY MOUTH TWICE DAILY, Disp: 180 tablet, Rfl: 1   olmesartan (BENICAR) 20 MG tablet, Take 1 tablet (20 mg total) by mouth daily., Disp: 90 tablet, Rfl: 1   Semaglutide (RYBELSUS) 7 MG TABS, Take by mouth., Disp: , Rfl:    No Known Allergies   Review of Systems  Constitutional: Negative.   Respiratory: Negative.    Cardiovascular: Negative.   Neurological: Negative.   Psychiatric/Behavioral: Negative.       Today's Vitals   11/04/22 1504 11/04/22 1606  BP: (!) 142/100 (!) 142/92  Pulse: 81   Temp: 98.1 F (36.7 C)    SpO2: 98%   Weight: 151 lb 6.4 oz (68.7 kg)   Height: 5' (1.524 m)    Body mass index is 29.57 kg/m.   Objective:  Physical Exam Vitals reviewed.  Constitutional:      General: She is not in acute distress.    Appearance: Normal appearance.  Cardiovascular:     Rate and Rhythm: Normal rate and regular rhythm.     Pulses: Normal pulses.     Heart sounds: Normal heart sounds. No murmur heard. Pulmonary:     Effort: Pulmonary effort is normal. No respiratory distress.     Breath sounds: Normal breath sounds. No wheezing.  Musculoskeletal:     Cervical back: Normal range of motion and neck supple.  Skin:    General: Skin is warm and dry.     Capillary Refill: Capillary refill takes less than 2 seconds.  Neurological:     General: No focal deficit present.     Mental Status: She is alert and oriented to person, place, and time.     Cranial Nerves: No cranial nerve deficit.     Motor: No weakness.  Psychiatric:        Mood and Affect: Mood normal.        Behavior: Behavior  normal.        Thought Content: Thought content normal.        Judgment: Judgment normal.         Assessment And Plan:     1. Ankle swelling, unspecified laterality Comments: No swelling noted, will check autoimmune profile and Uric acid to check for gout. - Autoimmune Profile - Uric acid - Sed Rate (ESR)  2. Hypertensive nephropathy Comments: Blood pressure is slightly elevated, continue with your medications and modify lifestyle. - CMP14+EGFR  3. Type 2 diabetes mellitus with stage 3a chronic kidney disease, without long-term current use of insulin (HCC) Comments: HgbA1c is slightly elevated, she is to continue her current medications. - CMP14+EGFR - Hemoglobin A1c  4. Pure hypercholesterolemia Comments: She needs to be on a statin, will check lipid panel and try a low dose of statin. - CMP14+EGFR - Lipid panel  5. Bilateral knee swelling - Autoimmune Profile - Uric acid - Sed Rate  (ESR)  6. Other long term (current) drug therapy - CBC     Patient was given opportunity to ask questions. Patient verbalized understanding of the plan and was able to repeat key elements of the plan. All questions were answered to their satisfaction.  Minette Brine, FNP   I, Minette Brine, FNP, have reviewed all documentation for this visit. The documentation on 11/04/22 for the exam, diagnosis, procedures, and orders are all accurate and complete.   IF YOU HAVE BEEN REFERRED TO A SPECIALIST, IT MAY TAKE 1-2 WEEKS TO SCHEDULE/PROCESS THE REFERRAL. IF YOU HAVE NOT HEARD FROM US/SPECIALIST IN TWO WEEKS, PLEASE GIVE [place] A CALL AT [phone] X [phone].   THE PATIENT IS ENCOURAGED TO PRACTICE SOCIAL DISTANCING DUE TO THE COVID-19 PANDEMIC.

## 2022-11-05 DIAGNOSIS — I129 Hypertensive chronic kidney disease with stage 1 through stage 4 chronic kidney disease, or unspecified chronic kidney disease: Secondary | ICD-10-CM | POA: Diagnosis not present

## 2022-11-05 DIAGNOSIS — Z87891 Personal history of nicotine dependence: Secondary | ICD-10-CM | POA: Diagnosis not present

## 2022-11-05 DIAGNOSIS — Z7984 Long term (current) use of oral hypoglycemic drugs: Secondary | ICD-10-CM | POA: Diagnosis not present

## 2022-11-05 DIAGNOSIS — E1122 Type 2 diabetes mellitus with diabetic chronic kidney disease: Secondary | ICD-10-CM | POA: Diagnosis not present

## 2022-11-05 DIAGNOSIS — N183 Chronic kidney disease, stage 3 unspecified: Secondary | ICD-10-CM | POA: Diagnosis not present

## 2022-11-05 DIAGNOSIS — Z79899 Other long term (current) drug therapy: Secondary | ICD-10-CM | POA: Diagnosis not present

## 2022-11-05 DIAGNOSIS — H18232 Secondary corneal edema, left eye: Secondary | ICD-10-CM | POA: Diagnosis not present

## 2022-11-05 DIAGNOSIS — E785 Hyperlipidemia, unspecified: Secondary | ICD-10-CM | POA: Diagnosis not present

## 2022-11-05 LAB — AUTOIMMUNE PROFILE
Anti Nuclear Antibody (ANA): NEGATIVE
Complement C3, Serum: 170 mg/dL — ABNORMAL HIGH (ref 82–167)
dsDNA Ab: 1 IU/mL (ref 0–9)

## 2022-11-05 LAB — CMP14+EGFR
ALT: 19 IU/L (ref 0–32)
AST: 29 IU/L (ref 0–40)
Albumin/Globulin Ratio: 1.2 (ref 1.2–2.2)
Albumin: 4 g/dL (ref 3.8–4.8)
Alkaline Phosphatase: 124 IU/L — ABNORMAL HIGH (ref 44–121)
BUN/Creatinine Ratio: 14 (ref 12–28)
BUN: 16 mg/dL (ref 8–27)
Bilirubin Total: 0.2 mg/dL (ref 0.0–1.2)
CO2: 26 mmol/L (ref 20–29)
Calcium: 10.1 mg/dL (ref 8.7–10.3)
Chloride: 105 mmol/L (ref 96–106)
Creatinine, Ser: 1.11 mg/dL — ABNORMAL HIGH (ref 0.57–1.00)
Globulin, Total: 3.3 g/dL (ref 1.5–4.5)
Glucose: 184 mg/dL — ABNORMAL HIGH (ref 70–99)
Potassium: 4.3 mmol/L (ref 3.5–5.2)
Sodium: 143 mmol/L (ref 134–144)
Total Protein: 7.3 g/dL (ref 6.0–8.5)
eGFR: 51 mL/min/{1.73_m2} — ABNORMAL LOW (ref >59)

## 2022-11-05 LAB — CBC
Hematocrit: 34.7 % (ref 34.0–46.6)
Hemoglobin: 11.5 g/dL (ref 11.1–15.9)
MCH: 27.8 pg (ref 26.6–33.0)
MCHC: 33.1 g/dL (ref 31.5–35.7)
MCV: 84 fL (ref 79–97)
Platelets: 300 10*3/uL (ref 150–450)
RBC: 4.14 x10E6/uL (ref 3.77–5.28)
RDW: 12.6 % (ref 11.7–15.4)
WBC: 4.6 10*3/uL (ref 3.4–10.8)

## 2022-11-05 LAB — LIPID PANEL
Chol/HDL Ratio: 3.1 ratio (ref 0.0–4.4)
Cholesterol, Total: 207 mg/dL — ABNORMAL HIGH (ref 100–199)
HDL: 66 mg/dL (ref >39)
LDL Chol Calc (NIH): 113 mg/dL — ABNORMAL HIGH (ref 0–99)
Triglycerides: 163 mg/dL — ABNORMAL HIGH (ref 0–149)
VLDL Cholesterol Cal: 28 mg/dL (ref 5–40)

## 2022-11-05 LAB — HEMOGLOBIN A1C
Est. average glucose Bld gHb Est-mCnc: 146 mg/dL
Hgb A1c MFr Bld: 6.7 % — ABNORMAL HIGH (ref 4.8–5.6)

## 2022-11-05 LAB — URIC ACID: Uric Acid: 3.5 mg/dL (ref 3.1–7.9)

## 2022-11-05 LAB — SEDIMENTATION RATE: Sed Rate: 32 mm/hr (ref 0–40)

## 2022-11-10 HISTORY — PX: CORNEAL TRANSPLANT: SHX108

## 2022-11-11 MED ORDER — ATORVASTATIN CALCIUM 20 MG PO TABS: ORAL_TABLET | ORAL | 1 refills | Status: DC

## 2022-11-21 HISTORY — PX: CORNEAL TRANSPLANT: SHX108

## 2022-12-11 DIAGNOSIS — H40003 Preglaucoma, unspecified, bilateral: Secondary | ICD-10-CM | POA: Insufficient documentation

## 2022-12-11 DIAGNOSIS — H04123 Dry eye syndrome of bilateral lacrimal glands: Secondary | ICD-10-CM | POA: Insufficient documentation

## 2022-12-11 DIAGNOSIS — Z947 Corneal transplant status: Secondary | ICD-10-CM | POA: Insufficient documentation

## 2022-12-11 DIAGNOSIS — Z961 Presence of intraocular lens: Secondary | ICD-10-CM | POA: Insufficient documentation

## 2022-12-11 DIAGNOSIS — H16142 Punctate keratitis, left eye: Secondary | ICD-10-CM | POA: Insufficient documentation

## 2023-01-21 ENCOUNTER — Encounter: Payer: Self-pay | Admitting: Nurse Practitioner

## 2023-01-21 ENCOUNTER — Ambulatory Visit: Payer: Medicare Other | Admitting: Nurse Practitioner

## 2023-01-21 ENCOUNTER — Ambulatory Visit (INDEPENDENT_AMBULATORY_CARE_PROVIDER_SITE_OTHER): Payer: 59

## 2023-01-21 ENCOUNTER — Ambulatory Visit (INDEPENDENT_AMBULATORY_CARE_PROVIDER_SITE_OTHER): Payer: 59 | Admitting: Nurse Practitioner

## 2023-01-21 VITALS — BP 142/68 | HR 80 | Temp 98.0°F | Ht <= 58 in | Wt 153.0 lb

## 2023-01-21 VITALS — Ht <= 58 in | Wt 147.0 lb

## 2023-01-21 DIAGNOSIS — Z0000 Encounter for general adult medical examination without abnormal findings: Secondary | ICD-10-CM | POA: Diagnosis not present

## 2023-01-21 DIAGNOSIS — E1122 Type 2 diabetes mellitus with diabetic chronic kidney disease: Secondary | ICD-10-CM

## 2023-01-21 DIAGNOSIS — M25473 Effusion, unspecified ankle: Secondary | ICD-10-CM | POA: Diagnosis not present

## 2023-01-21 DIAGNOSIS — N1831 Chronic kidney disease, stage 3a: Secondary | ICD-10-CM | POA: Diagnosis not present

## 2023-01-21 MED ORDER — SEMAGLUTIDE(0.25 OR 0.5MG/DOS) 2 MG/3ML ~~LOC~~ SOPN: 0.5000 mg | PEN_INJECTOR | SUBCUTANEOUS | 1 refills | Status: DC

## 2023-01-21 NOTE — Progress Notes (Signed)
I connected with  Kristen Cannon on 01/21/23 by a audio enabled telemedicine application and verified that I am speaking with the correct person using two identifiers.  Patient Location: Home  Provider Location: Office/Clinic  I discussed the limitations of evaluation and management by telemedicine. The patient expressed understanding and agreed to proceed.  Subjective:   Kristen Cannon is a 77 y.o. female who presents for Medicare Annual (Subsequent) preventive examination.  Review of Systems     Cardiac Risk Factors include: advanced age (>47men, >75 women);diabetes mellitus;hypertension;obesity (BMI >30kg/m2)     Objective:    Today's Vitals   01/21/23 1016  Weight: 147 lb (66.7 kg)  Height: 4\' 9"  (1.448 m)   Body mass index is 31.81 kg/m.     01/21/2023   10:22 AM 12/26/2021   10:37 AM 12/13/2020   10:14 AM 12/07/2019    2:21 PM 07/14/2019    9:38 AM  Advanced Directives  Does Patient Have a Medical Advance Directive? No No No No No  Would patient like information on creating a medical advance directive?  No - Patient declined No - Patient declined  Yes (MAU/Ambulatory/Procedural Areas - Information given)    Current Medications (verified) Outpatient Encounter Medications as of 01/21/2023  Medication Sig   amLODipine (NORVASC) 10 MG tablet Take 1 tablet (10 mg total) by mouth daily.   aspirin EC 81 MG tablet Take 81 mg by mouth every morning.   atorvastatin (LIPITOR) 20 MG tablet Take 1 tablet by mouth at bedtime   glipiZIDE (GLUCOTROL) 10 MG tablet TAKE 1 TABLET(10 MG) BY MOUTH TWICE DAILY   olmesartan (BENICAR) 20 MG tablet Take 1 tablet (20 mg total) by mouth daily.   Semaglutide (RYBELSUS) 7 MG TABS Take by mouth.   No facility-administered encounter medications on file as of 01/21/2023.    Allergies (verified) Patient has no known allergies.   History: Past Medical History:  Diagnosis Date   Diabetes mellitus    High cholesterol     Hypertension    Past Surgical History:  Procedure Laterality Date   CATARACT EXTRACTION Left 12/2018   DR GROAT   CORNEAL TRANSPLANT Left 11/10/2022   CORNEAL TRANSPLANT Left 11/21/2022   PARTIAL HYSTERECTOMY  1974   Family History  Problem Relation Age of Onset   Breast cancer Sister    Breast cancer Daughter    Early death Mother    Early death Father    Social History   Socioeconomic History   Marital status: Divorced    Spouse name: Not on file   Number of children: Not on file   Years of education: Not on file   Highest education level: Not on file  Occupational History   Occupation: retired  Tobacco Use   Smoking status: Former    Packs/day: 1.00    Years: 12.00    Additional pack years: 0.00    Total pack years: 12.00    Types: Cigars, Cigarettes    Quit date: 2007    Years since quitting: 17.2   Smokeless tobacco: Never   Tobacco comments:    1 cigar a week  Vaping Use   Vaping Use: Never used  Substance and Sexual Activity   Alcohol use: Never   Drug use: No   Sexual activity: Not Currently  Other Topics Concern   Not on file  Social History Narrative   Not on file   Social Determinants of Health   Financial Resource Strain: Low Risk  (  01/21/2023)   Overall Financial Resource Strain (CARDIA)    Difficulty of Paying Living Expenses: Not hard at all  Food Insecurity: No Food Insecurity (01/21/2023)   Hunger Vital Sign    Worried About Running Out of Food in the Last Year: Never true    Ran Out of Food in the Last Year: Never true  Transportation Needs: No Transportation Needs (01/21/2023)   PRAPARE - Administrator, Civil ServiceTransportation    Lack of Transportation (Medical): No    Lack of Transportation (Non-Medical): No  Physical Activity: Inactive (01/21/2023)   Exercise Vital Sign    Days of Exercise per Week: 0 days    Minutes of Exercise per Session: 0 min  Stress: No Stress Concern Present (01/21/2023)   [employer] - Occupational  Stress Questionnaire    Feeling of Stress : Not at all  Social Connections: Not on file    Tobacco Counseling Counseling given: Not Answered Tobacco comments: 1 cigar a week   Clinical Intake:  Pre-visit preparation completed: Yes  Pain : No/denies pain     Nutritional Status: BMI > 30  Obese Nutritional Risks: None Diabetes: Yes  How often do you need to have someone help you when you read instructions, pamphlets, or other written materials from your doctor or pharmacy?: 1 - Never  Diabetic? Yes Nutrition Risk Assessment:  Has the patient had any N/V/D within the last 2 months?  No  Does the patient have any non-healing wounds?  No  Has the patient had any unintentional weight loss or weight gain?  No   Diabetes:  Is the patient diabetic?  Yes  If diabetic, was a CBG obtained today?  No  Did the patient bring in their glucometer from home?  No  How often do you monitor your CBG's? Does not.   Financial Strains and Diabetes Management:  Are you having any financial strains with the device, your supplies or your medication? No .  Does the patient want to be seen by Chronic Care Management for management of their diabetes?  No  Would the patient like to be referred to a Nutritionist or for Diabetic Management?  No   Diabetic Exams:  Diabetic Eye Exam: Overdue for diabetic eye exam. Pt has been advised about the importance in completing this exam. Patient advised to call and schedule an eye exam. Diabetic Foot Exam: Completed 12/26/2021   Interpreter Needed?: No  Information entered by :: NAllen LPN   Activities of Daily Living    01/21/2023   10:23 AM  In your present state of health, do you have any difficulty performing the following activities:  Hearing? 0  Vision? 0  Difficulty concentrating or making decisions? 1  Comment memory sometimes  Walking or climbing stairs? 1  Dressing or bathing? 0  Doing errands, shopping? 0  Preparing Food and eating ?  N  Using the Toilet? N  In the past six months, have you accidently leaked urine? N  Do you have problems with loss of bowel control? N  Managing your Medications? N  Managing your Finances? N  Housekeeping or managing your Housekeeping? N    Patient Care Team: Arnette FeltsMoore, Janece, FNP as PCP - General (General Practice)  Indicate any recent Medical Services you may have received from other than [facility] providers in the past year (date may be approximate).     Assessment:   This is a routine wellness examination for [facility] Inc[facility]atherine.  Hearing/Vision screen Vision Screening - Comments:: Regular eye  exams, [facility]  Dietary issues and exercise activities discussed: Current Exercise Habits: The patient does not participate in regular exercise at present   Goals Addressed             This Visit's Progress    Patient Stated       01/21/2023, drink more water       Depression Screen    01/21/2023   10:23 AM 11/04/2022    3:04 PM 09/10/2022    4:19 PM 07/29/2022   10:15 AM 06/25/2022   11:31 AM 06/25/2022   11:15 AM 12/26/2021   10:38 AM  PHQ 2/9 Scores  PHQ - 2 Score 0 0 0 0 0 0 6  PHQ- 9 Score  0 0 0 3  11    Fall Risk    01/21/2023   10:23 AM 11/04/2022    3:04 PM 09/10/2022    4:16 PM 07/29/2022   10:15 AM 06/25/2022   11:15 AM  Fall Risk   Falls in the past year? 0 0 0 0 0  Number falls in past yr: 0 0 0 0 0  Injury with Fall? 0 0 0 0 0  Risk for fall due to : Medication side effect No Fall Risks No Fall Risks No Fall Risks No Fall Risks  Follow up Falls prevention discussed;Education provided;Falls evaluation completed Falls evaluation completed Falls evaluation completed Falls evaluation completed Falls evaluation completed    FALL RISK PREVENTION PERTAINING TO THE HOME:  Any stairs in or around the home? Yes  If so, are there any without handrails? No  Home free of loose throw rugs in walkways, pet beds, electrical cords, etc? Yes  Adequate lighting in your home  to reduce risk of falls? Yes   ASSISTIVE DEVICES UTILIZED TO PREVENT FALLS:  Life alert? No  Use of a cane, walker or w/c? No  Grab bars in the bathroom? Yes  Shower chair or bench in shower? No  Elevated toilet seat or a handicapped toilet? Yes   TIMED UP AND GO:  Was the test performed? No .      Cognitive Function:        01/21/2023   10:25 AM 12/26/2021   10:43 AM 12/13/2020   10:16 AM 12/07/2019    2:35 PM 07/14/2019    9:42 AM  6CIT Screen  What Year? 0 points 0 points 0 points 0 points 0 points  What month? 0 points 0 points 0 points 0 points 0 points  What time? 0 points 3 points 0 points 0 points 0 points  Count back from 20 0 points 0 points 0 points 0 points 0 points  Months in reverse 0 points 0 points 0 points 0 points 0 points  Repeat phrase 2 points 0 points 0 points 0 points 0 points  Total Score 2 points 3 points 0 points 0 points 0 points    Immunizations Immunization History  Administered Date(s) Administered   COVID-19, mRNA, vaccine(Comirnaty)12 years and older 07/31/2022   Fluad Quad(high Dose 65+) 08/11/2019, 07/22/2021, 06/25/2022   Influenza, High Dose Seasonal PF 07/14/2019   Influenza-Unspecified 07/07/2018   PFIZER(Purple Top)SARS-COV-2 Vaccination 11/26/2019, 12/21/2019, 08/06/2020   Pneumococcal Conjugate-13 09/15/2013   Pneumococcal Polysaccharide-23 09/21/2014, 07/22/2021   Tdap 01/08/2018   Zoster Recombinat (Shingrix) 06/14/2016, 08/06/2020    TDAP status: Up to date  Flu Vaccine status: Up to date  Pneumococcal vaccine status: Up to date  Covid-19 vaccine status: Completed vaccines  Qualifies for Shingles  Vaccine? Yes   Zostavax completed Yes   Shingrix Completed?: Yes  Screening Tests Health Maintenance  Topic Date Due   OPHTHALMOLOGY EXAM  09/25/2022   COVID-19 Vaccine (5 - 2023-24 season) 09/25/2022   Medicare Annual Wellness (AWV)  12/27/2022   FOOT EXAM  12/27/2022   HEMOGLOBIN A1C  05/05/2023   INFLUENZA VACCINE   05/14/2023   Diabetic kidney evaluation - Urine ACR  07/30/2023   Diabetic kidney evaluation - eGFR measurement  11/05/2023   DTaP/Tdap/Td (2 - Td or Tdap) 01/09/2028   Pneumonia Vaccine 56+ Years old  Completed   DEXA SCAN  Completed   Hepatitis C Screening  Completed   Zoster Vaccines- Shingrix  Completed   HPV VACCINES  Aged Out    Health Maintenance  Health Maintenance Due  Topic Date Due   OPHTHALMOLOGY EXAM  09/25/2022   COVID-19 Vaccine (5 - 2023-24 season) 09/25/2022   Medicare Annual Wellness (AWV)  12/27/2022   FOOT EXAM  12/27/2022    Colorectal cancer screening: No longer required.   Mammogram status: No longer required due to age.  Bone Density status: Completed 05/23/2020.   Lung Cancer Screening: (Low Dose CT Chest recommended if Age 35-80 years, 30 pack-year currently smoking OR have quit w/in 15years.) does not qualify.   Lung Cancer Screening Referral: no  Additional Screening:  Hepatitis C Screening: does qualify; Completed 06/24/2017  Vision Screening: Recommended annual ophthalmology exams for early detection of glaucoma and other disorders of the eye. Is the patient up to date with their annual eye exam?  Yes  Who is the provider or what is the name of the office in which the patient attends annual eye exams? Dr. Dione Booze  If pt is not established with a provider, would they like to be referred to a provider to establish care? No .   Dental Screening: Recommended annual dental exams for proper oral hygiene  Community Resource Referral / Chronic Care Management: CRR required this visit?  No   CCM required this visit?  No      Plan:     I have personally reviewed and noted the following in the patient's chart:   Medical and social history Use of alcohol, tobacco or illicit drugs  Current medications and supplements including opioid prescriptions. Patient is not currently taking opioid prescriptions. Functional ability and status Nutritional  status Physical activity Advanced directives List of other physicians Hospitalizations, surgeries, and ER visits in previous 12 months Vitals Screenings to include cognitive, depression, and falls Referrals and appointments  In addition, I have reviewed and discussed with patient certain preventive protocols, quality metrics, and best practice recommendations. A written personalized care plan for preventive services as well as general preventive health recommendations were provided to patient.     Barb Merino, LPN   1/61/0960   Nurse Notes: none  Due to this being a virtual visit, the after visit summary with patients personalized plan was offered to patient via mail or my-chart.  to pick up at office at next visit

## 2023-01-21 NOTE — Patient Instructions (Signed)
[name] , Thank you for taking time to come for your Medicare Wellness Visit. I appreciate your ongoing commitment to your health goals. Please review the following plan we discussed and let me know if I can assist you in the future.   These are the goals we discussed:  Goals      Patient Stated     07/14/2019, no goals     Patient Stated     12/07/2019 no goals     Patient Stated     12/13/2020, get off diabetes medication     Patient Stated     12/26/2021, gain weight      Patient Stated     01/21/2023, drink more water        This is a list of the screening recommended for you and due dates:  Health Maintenance  Topic Date Due   Eye exam for diabetics  09/25/2022   COVID-19 Vaccine (5 - 2023-24 season) 09/25/2022   Complete foot exam   12/27/2022   Hemoglobin A1C  05/05/2023   Flu Shot  05/14/2023   Yearly kidney health urinalysis for diabetes  07/30/2023   Yearly kidney function blood test for diabetes  11/05/2023   Medicare Annual Wellness Visit  01/21/2024   DTaP/Tdap/Td vaccine (2 - Td or Tdap) 01/09/2028   Pneumonia Vaccine  Completed   DEXA scan (bone density measurement)  Completed   Hepatitis C Screening: USPSTF Recommendation to screen - Ages 44-79 yo.  Completed   Zoster (Shingles) Vaccine  Completed   HPV Vaccine  Aged Out    Advanced directives: Advance directive discussed with you today.   Conditions/risks identified: none  Next appointment: Follow up in one year for your annual wellness visit    Preventive Care 65 Years and Older, Female Preventive care refers to lifestyle choices and visits with your health care provider that can promote health and wellness. What does preventive care include? A yearly physical exam. This is also called an annual well check. Dental exams once or twice a year. Routine eye exams. Ask your health care provider how often you should have your eyes checked. Personal lifestyle choices, including: Daily care of your  teeth and gums. Regular physical activity. Eating a healthy diet. Avoiding tobacco and drug use. Limiting alcohol use. Practicing safe sex. Taking low-dose aspirin every day. Taking vitamin and mineral supplements as recommended by your health care provider. What happens during an annual well check? The services and screenings done by your health care provider during your annual well check will depend on your age, overall health, lifestyle risk factors, and family history of disease. Counseling  Your health care provider may ask you questions about your: Alcohol use. Tobacco use. Drug use. Emotional well-being. Home and relationship well-being. Sexual activity. Eating habits. History of falls. Memory and ability to understand (cognition). Work and work Astronomer. Reproductive health. Screening  You may have the following tests or measurements: Height, weight, and BMI. Blood pressure. Lipid and cholesterol levels. These may be checked every 5 years, or more frequently if you are over 24 years old. Skin check. Lung cancer screening. You may have this screening every year starting at age 34 if you have a 30-pack-year history of smoking and currently smoke or have quit within the past 15 years. Fecal occult blood test (FOBT) of the stool. You may have this test every year starting at age 36. Flexible sigmoidoscopy or colonoscopy. You may have a sigmoidoscopy every 5 years or a  colonoscopy every 10 years starting at age 48. Hepatitis C blood test. Hepatitis B blood test. Sexually transmitted disease (STD) testing. Diabetes screening. This is done by checking your blood sugar (glucose) after you have not eaten for a while (fasting). You may have this done every 1-3 years. Bone density scan. This is done to screen for osteoporosis. You may have this done starting at age 22. Mammogram. This may be done every 1-2 years. Talk to your health care provider about how often you should have  regular mammograms. Talk with your health care provider about your test results, treatment options, and if necessary, the need for more tests. Vaccines  Your health care provider may recommend certain vaccines, such as: Influenza vaccine. This is recommended every year. Tetanus, diphtheria, and acellular pertussis (Tdap, Td) vaccine. You may need a Td booster every 10 years. Zoster vaccine. You may need this after age 48. Pneumococcal 13-valent conjugate (PCV13) vaccine. One dose is recommended after age 47. Pneumococcal polysaccharide (PPSV23) vaccine. One dose is recommended after age 28. Talk to your health care provider about which screenings and vaccines you need and how often you need them. This information is not intended to replace advice given to you by your health care provider. Make sure you discuss any questions you have with your health care provider. Document Released: 10/26/2015 Document Revised: 06/18/2016 Document Reviewed: 07/31/2015 Elsevier Interactive Patient Education  2017 [place] Prevention in the Home Falls can cause injuries. They can happen to people of all ages. There are many things you can do to make your home safe and to help prevent falls. What can I do on the outside of my home? Regularly fix the edges of walkways and driveways and fix any cracks. Remove anything that might make you trip as you walk through a door, such as a raised step or threshold. Trim any bushes or trees on the path to your home. Use bright outdoor lighting. Clear any walking paths of anything that might make someone trip, such as rocks or tools. Regularly check to see if handrails are loose or broken. Make sure that both sides of any steps have handrails. Any raised decks and porches should have guardrails on the edges. Have any leaves, snow, or ice cleared regularly. Use sand or salt on walking paths during winter. Clean up any spills in your garage right away. This  includes oil or grease spills. What can I do in the bathroom? Use night lights. Install grab bars by the toilet and in the tub and shower. Do not use towel bars as grab bars. Use non-skid mats or decals in the tub or shower. If you need to sit down in the shower, use a plastic, non-slip stool. Keep the floor dry. Clean up any water that spills on the floor as soon as it happens. Remove soap buildup in the tub or shower regularly. Attach bath mats securely with double-sided non-slip rug tape. Do not have throw rugs and other things on the floor that can make you trip. What can I do in the bedroom? Use night lights. Make sure that you have a light by your bed that is easy to reach. Do not use any sheets or blankets that are too big for your bed. They should not hang down onto the floor. Have a firm chair that has side arms. You can use this for support while you get dressed. Do not have throw rugs and other things on the floor that can  make you trip. What can I do in the kitchen? Clean up any spills right away. Avoid walking on wet floors. Keep items that you use a lot in easy-to-reach places. If you need to reach something above you, use a strong step stool that has a grab bar. Keep electrical cords out of the way. Do not use floor polish or wax that makes floors slippery. If you must use wax, use non-skid floor wax. Do not have throw rugs and other things on the floor that can make you trip. What can I do with my stairs? Do not leave any items on the stairs. Make sure that there are handrails on both sides of the stairs and use them. Fix handrails that are broken or loose. Make sure that handrails are as long as the stairways. Check any carpeting to make sure that it is firmly attached to the stairs. Fix any carpet that is loose or worn. Avoid having throw rugs at the top or bottom of the stairs. If you do have throw rugs, attach them to the floor with carpet tape. Make sure that you  have a light switch at the top of the stairs and the bottom of the stairs. If you do not have them, ask someone to add them for you. What else can I do to help prevent falls? Wear shoes that: Do not have high heels. Have rubber bottoms. Are comfortable and fit you well. Are closed at the toe. Do not wear sandals. If you use a stepladder: Make sure that it is fully opened. Do not climb a closed stepladder. Make sure that both sides of the stepladder are locked into place. Ask someone to hold it for you, if possible. Clearly mark and make sure that you can see: Any grab bars or handrails. First and last steps. Where the edge of each step is. Use tools that help you move around (mobility aids) if they are needed. These include: Canes. Walkers. Scooters. Crutches. Turn on the lights when you go into a dark area. Replace any light bulbs as soon as they burn out. Set up your furniture so you have a clear path. Avoid moving your furniture around. If any of your floors are uneven, fix them. If there are any pets around you, be aware of where they are. Review your medicines with your doctor. Some medicines can make you feel dizzy. This can increase your chance of falling. Ask your doctor what other things that you can do to help prevent falls. This information is not intended to replace advice given to you by your health care provider. Make sure you discuss any questions you have with your health care provider. Document Released: 07/26/2009 Document Revised: 03/06/2016 Document Reviewed: 11/03/2014 Elsevier Interactive Patient Education  2017 [employer].

## 2023-01-21 NOTE — Progress Notes (Signed)
I,Sheena H Holbrook,acting as a Neurosurgeon for Arnette Felts, FNP.,have documented all relevant documentation on the behalf of Arnette Felts, FNP,as directed by  Arnette Felts, FNP while in the presence of Arnette Felts, FNP.    Subjective:     Patient ID: Kristen Cannon , female    DOB: Feb 21, 1946 , 77 y.o.   MRN: [id number]   Chief Complaint  Patient presents with   Medical Management of Chronic Issues    HPI  Patient presents today for follow up visit. Patient reports continued BLE edema, she states it never goes away. She is reporting swelling to right ankle and knees.      Past Medical History:  Diagnosis Date   Diabetes mellitus    High cholesterol    Hypertension      Family History  Problem Relation Age of Onset   Breast cancer Sister    Breast cancer Daughter    Early death Mother    Early death Father      Current Outpatient Medications:    amLODipine (NORVASC) 10 MG tablet, Take 1 tablet (10 mg total) by mouth daily., Disp: 30 tablet, Rfl: 1   aspirin EC 81 MG tablet, Take 81 mg by mouth every morning., Disp: , Rfl:    atorvastatin (LIPITOR) 20 MG tablet, Take 1 tablet by mouth at bedtime, Disp: 90 tablet, Rfl: 1   glipiZIDE (GLUCOTROL) 10 MG tablet, TAKE 1 TABLET(10 MG) BY MOUTH TWICE DAILY, Disp: 180 tablet, Rfl: 1   olmesartan (BENICAR) 20 MG tablet, Take 1 tablet (20 mg total) by mouth daily., Disp: 90 tablet, Rfl: 1   Semaglutide,0.25 or 0.5MG /DOS, 2 MG/3ML SOPN, Inject 0.5 mg into the skin once a week., Disp: 9 mL, Rfl: 1   No Known Allergies   Review of Systems  Cardiovascular:  Positive for leg swelling.  All other systems reviewed and are negative.    Today's Vitals   01/21/23 1451  BP: (!) 142/68  Pulse: 80  Temp: 98 F (36.7 C)  TempSrc: Oral  SpO2: 98%  Weight: 153 lb (69.4 kg)  Height:  (1.448 m)   Body mass index is 33.11 kg/m.   Objective:  Physical Exam Vitals reviewed.  Constitutional:      General: She is not in  acute distress.    Appearance: Normal appearance.  Cardiovascular:     Rate and Rhythm: Normal rate and regular rhythm.     Pulses: Normal pulses.     Heart sounds: Normal heart sounds. No murmur heard. Pulmonary:     Effort: Pulmonary effort is normal. No respiratory distress.     Breath sounds: Normal breath sounds.  Neurological:     General: No focal deficit present.     Mental Status: She is alert and oriented to person, place, and time.     Cranial Nerves: No cranial nerve deficit.     Motor: No weakness.         Assessment And Plan:     1. Ankle swelling, unspecified laterality Comments: She has trace amount of puffiness to her malleolus area likely related to not elevating her legs. No changes however encouraged to wear support socks  2. Type 2 diabetes mellitus with stage 3a chronic kidney disease, without long-term current use of insulin Comments: continue Ozempic, no labs today - Semaglutide,0.25 or 0.5MG /DOS, 2 MG/3ML SOPN; Inject 0.5 mg into the skin once a week.  Dispense: 9 mL; Refill: 1     Patient was given opportunity  to ask questions. Patient verbalized understanding of the plan and was able to repeat key elements of the plan. All questions were answered to their satisfaction.  Arnette Felts, FNP   I, Arnette Felts, FNP, have reviewed all documentation for this visit. The documentation on 01/21/23 for the exam, diagnosis, procedures, and orders are all accurate and complete.   IF YOU HAVE BEEN REFERRED TO A SPECIALIST, IT MAY TAKE 1-2 WEEKS TO SCHEDULE/PROCESS THE REFERRAL. IF YOU HAVE NOT HEARD FROM US/SPECIALIST IN TWO WEEKS, PLEASE GIVE [place] A CALL AT [phone].   THE PATIENT IS ENCOURAGED TO PRACTICE SOCIAL DISTANCING DUE TO THE COVID-19 PANDEMIC.

## 2023-01-23 DIAGNOSIS — H16142 Punctate keratitis, left eye: Secondary | ICD-10-CM | POA: Diagnosis not present

## 2023-01-23 DIAGNOSIS — Z947 Corneal transplant status: Secondary | ICD-10-CM | POA: Diagnosis not present

## 2023-01-23 DIAGNOSIS — H04123 Dry eye syndrome of bilateral lacrimal glands: Secondary | ICD-10-CM | POA: Diagnosis not present

## 2023-01-23 DIAGNOSIS — H40003 Preglaucoma, unspecified, bilateral: Secondary | ICD-10-CM | POA: Diagnosis not present

## 2023-03-31 ENCOUNTER — Encounter: Payer: Self-pay | Admitting: Nurse Practitioner

## 2023-03-31 ENCOUNTER — Ambulatory Visit (INDEPENDENT_AMBULATORY_CARE_PROVIDER_SITE_OTHER): Payer: 59 | Admitting: Nurse Practitioner

## 2023-03-31 VITALS — BP 170/90 | HR 85 | Temp 98.5°F | Ht <= 58 in | Wt 157.4 lb

## 2023-03-31 DIAGNOSIS — I129 Hypertensive chronic kidney disease with stage 1 through stage 4 chronic kidney disease, or unspecified chronic kidney disease: Secondary | ICD-10-CM

## 2023-03-31 DIAGNOSIS — Z2821 Immunization not carried out because of patient refusal: Secondary | ICD-10-CM | POA: Diagnosis not present

## 2023-03-31 DIAGNOSIS — E6609 Other obesity due to excess calories: Secondary | ICD-10-CM

## 2023-03-31 DIAGNOSIS — I700 Atherosclerosis of aorta: Secondary | ICD-10-CM | POA: Diagnosis not present

## 2023-03-31 DIAGNOSIS — E7800 Pure hypercholesterolemia, unspecified: Secondary | ICD-10-CM

## 2023-03-31 DIAGNOSIS — N1831 Chronic kidney disease, stage 3a: Secondary | ICD-10-CM

## 2023-03-31 DIAGNOSIS — E1122 Type 2 diabetes mellitus with diabetic chronic kidney disease: Secondary | ICD-10-CM

## 2023-03-31 DIAGNOSIS — Z5320 Procedure and treatment not carried out because of patient's decision for unspecified reasons: Secondary | ICD-10-CM | POA: Diagnosis not present

## 2023-03-31 DIAGNOSIS — E559 Vitamin D deficiency, unspecified: Secondary | ICD-10-CM | POA: Insufficient documentation

## 2023-03-31 DIAGNOSIS — Z7984 Long term (current) use of oral hypoglycemic drugs: Secondary | ICD-10-CM | POA: Diagnosis not present

## 2023-03-31 DIAGNOSIS — Z6830 Body mass index (BMI) 30.0-30.9, adult: Secondary | ICD-10-CM

## 2023-03-31 MED ORDER — OLMESARTAN MEDOXOMIL 20 MG PO TABS: 20.0000 mg | ORAL_TABLET | Freq: Every day | ORAL | 1 refills | Status: DC

## 2023-03-31 MED ORDER — GLIPIZIDE 10 MG PO TABS: ORAL_TABLET | ORAL | 1 refills | Status: DC

## 2023-03-31 MED ORDER — AMLODIPINE BESYLATE 10 MG PO TABS: 10.0000 mg | ORAL_TABLET | Freq: Every day | ORAL | 1 refills | Status: DC

## 2023-03-31 MED ORDER — ATORVASTATIN CALCIUM 20 MG PO TABS: ORAL_TABLET | ORAL | 1 refills | Status: DC

## 2023-03-31 NOTE — Progress Notes (Signed)
Madelaine Bhat, CMA,acting as a Neurosurgeon for Arnette Felts, FNP.,have documented all relevant documentation on the behalf of Arnette Felts, FNP,as directed by  Arnette Felts, FNP while in the presence of Arnette Felts, FNP.  Subjective:  Patient ID: Kristen Cannon , female    DOB: 09/09/1946 , 77 y.o.   MRN: [id number]  Chief Complaint  Patient presents with   Diabetes   Hyperlipidemia    HPI  Patient presents today for a DM, chol check, patient reports compliance with medications and has no other concerns. Patient denies any chest pains, SOB, or headaches.  BP Readings from Last 3 Encounters: 03/31/23 : (!) 170/80 01/21/23 : (!) 142/68 11/04/22 : (!) 142/92       Past Medical History:  Diagnosis Date   Diabetes mellitus    High cholesterol    Hypertension      Family History  Problem Relation Age of Onset   Breast cancer Sister    Breast cancer Daughter    Early death Mother    Early death Father      Current Outpatient Medications:    aspirin EC 81 MG tablet, Take 81 mg by mouth every morning., Disp: , Rfl:    Semaglutide,0.25 or 0.5MG /DOS, 2 MG/3ML SOPN, Inject 0.5 mg into the skin once a week., Disp: 9 mL, Rfl: 1   amLODipine (NORVASC) 10 MG tablet, Take 1 tablet (10 mg total) by mouth daily., Disp: 30 tablet, Rfl: 1   atorvastatin (LIPITOR) 20 MG tablet, Take 1 tablet by mouth at bedtime, Disp: 90 tablet, Rfl: 1   glipiZIDE (GLUCOTROL) 10 MG tablet, TAKE 1 TABLET(10 MG) BY MOUTH TWICE DAILY, Disp: 180 tablet, Rfl: 1   olmesartan (BENICAR) 20 MG tablet, Take 1 tablet (20 mg total) by mouth daily., Disp: 90 tablet, Rfl: 1   No Known Allergies   Review of Systems  Constitutional: Negative.  Negative for fatigue.  Respiratory: Negative.  Negative for shortness of breath.   Cardiovascular: Negative.  Negative for chest pain and palpitations.  Gastrointestinal: Negative.   Endocrine: Negative for polydipsia, polyphagia and polyuria.  Neurological: Negative.   Negative for dizziness and headaches.  Psychiatric/Behavioral: Negative.       Today's Vitals   03/31/23 1458  BP: (!) 170/80  Pulse: 85  Temp: 98.5 F (36.9 C)  TempSrc: Oral  Weight: 157 lb 6.4 oz (71.4 kg)  Height: 4\' 9"  (1.448 m)  PainSc: 0-No pain   Body mass index is 34.06 kg/m.  Wt Readings from Last 3 Encounters:  03/31/23 157 lb 6.4 oz (71.4 kg)  01/21/23 153 lb (69.4 kg)  01/21/23 147 lb (66.7 kg)    The 10-year ASCVD risk score (Arnett DK, et al., 2019) is: 53.3%   Values used to calculate the score:     Age: 42 years     Sex: Female     Is Non-Hispanic African American: Yes     Diabetic: Yes     Tobacco smoker: No     Systolic Blood Pressure: 170 mmHg     Is BP treated: Yes     HDL Cholesterol: 66 mg/dL     Total Cholesterol: 207 mg/dL  Objective:  Physical Exam Vitals reviewed.  Constitutional:      General: She is not in acute distress.    Appearance: Normal appearance. She is obese.  Cardiovascular:     Rate and Rhythm: Normal rate and regular rhythm.     Pulses: Normal pulses.  Heart sounds: Normal heart sounds. No murmur heard. Pulmonary:     Effort: Pulmonary effort is normal. No respiratory distress.     Breath sounds: Normal breath sounds. No wheezing.  Abdominal:     General: Abdomen is flat. Bowel sounds are normal.     Palpations: Abdomen is soft.  Musculoskeletal:     Cervical back: Normal range of motion and neck supple.  Skin:    General: Skin is warm and dry.     Capillary Refill: Capillary refill takes less than 2 seconds.  Neurological:     General: No focal deficit present.     Mental Status: She is alert and oriented to person, place, and time.     Cranial Nerves: No cranial nerve deficit.     Motor: No weakness.  Psychiatric:        Mood and Affect: Mood normal.        Behavior: Behavior normal.        Thought Content: Thought content normal.        Judgment: Judgment normal.         Assessment And Plan:  Type 2  diabetes mellitus with stage 3a chronic kidney disease, without long-term current use of insulin (HCC) Assessment & Plan: Hemoglobin A1c slightly elevated at last visit.  Advised to take medication regularly and discussed risk of poorly controlled diabetes.  Orders: -     Basic metabolic panel -     Hemoglobin A1c -     glipiZIDE; TAKE 1 TABLET(10 MG) BY MOUTH TWICE DAILY  Dispense: 180 tablet; Refill: 1 -     AMB Referral to Chronic Care Management Services  Pure hypercholesterolemia Assessment & Plan: Cholesterol levels slightly elevated.  Encouraged to take her statin as directed.  And to follow a low-fat diet.  Orders: -     Lipid panel -     Atorvastatin Calcium; Take 1 tablet by mouth at bedtime  Dispense: 90 tablet; Refill: 1 -     AMB Referral to Chronic Care Management Services  Hypertensive nephropathy Assessment & Plan: Blood pressure is elevated patient has not been taking her medications.  I have stressed the importance of adherence to medication regimen.  Explained risk of heart attack and strokes due to elevated blood pre.  Will refer to CCM for pharmacy assistance and nurse case manager.   Orders: -     amLODIPine Besylate; Take 1 tablet (10 mg total) by mouth daily.  Dispense: 30 tablet; Refill: 1 -     Olmesartan Medoxomil; Take 1 tablet (20 mg total) by mouth daily.  Dispense: 90 tablet; Refill: 1 -     AMB Referral to Chronic Care Management Services  Atherosclerosis of aorta [facility]) Assessment & Plan: Is encouraged to take a statin.  She has been declining in the past.   Hypertensive nephropathy Assessment & Plan: Blood pressure is elevated patient has not been taking her medications.  I have stressed the importance of adherence to medication regimen.  Explained risk of heart attack and strokes due to elevated blood pre.  Will refer to CCM for pharmacy assistance and nurse case manager.   Orders: -     amLODIPine Besylate; Take 1 tablet (10 mg total) by mouth  daily.  Dispense: 30 tablet; Refill: 1 -     Olmesartan Medoxomil; Take 1 tablet (20 mg total) by mouth daily.  Dispense: 90 tablet; Refill: 1 -     AMB Referral to Chronic Care Management Services  Vitamin D deficiency Assessment & Plan: Will check vitamin D level and supplement as needed.    Also encouraged to spend 15 minutes in the sun daily.     COVID-19 vaccination declined Assessment & Plan: Declines covid 19 vaccine. Discussed risk of covid 21 and if she changes her mind about the vaccine to call the office. Education has been provided regarding the importance of this vaccine but patient still declined. Advised may receive this vaccine at local pharmacy or Health Dept.or vaccine clinic. Aware to provide a copy of the vaccination record if obtained from local pharmacy or Health Dept.  Encouraged to take multivitamin, vitamin d, vitamin c and zinc to increase immune system. Aware can call office if would like to have vaccine here at office. Verbalized acceptance and understanding.    Patient refuses to take medication Assessment & Plan: Discussed risk of heart attack, stroke and renal failure would like to remain full code. States "I am going to start back on my medication"    Class 1 obesity due to excess calories with serious comorbidity and body mass index (BMI) of 30.0 to 30.9 in adult Assessment & Plan: She is encouraged to strive for BMI less than 30 to decrease cardiac risk. Advised to aim for at least 150 minutes of exercise per week.     Return for controlled DM check 4 months.  Patient was given opportunity to ask questions. Patient verbalized understanding of the plan and was able to repeat key elements of the plan. All questions were answered to their satisfaction.  Arnette Felts, FNP  I, Arnette Felts, FNP, have reviewed all documentation for this visit. The documentation on 03/31/23 for the exam, diagnosis, procedures, and orders are all accurate and complete.    IF YOU HAVE BEEN REFERRED TO A SPECIALIST, IT MAY TAKE 1-2 WEEKS TO SCHEDULE/PROCESS THE REFERRAL. IF YOU HAVE NOT HEARD FROM US/SPECIALIST IN TWO WEEKS, PLEASE GIVE [place] A CALL AT [phone].

## 2023-04-01 LAB — LIPID PANEL
Chol/HDL Ratio: 2.8 ratio (ref 0.0–4.4)
Cholesterol, Total: 228 mg/dL — ABNORMAL HIGH (ref 100–199)
HDL: 82 mg/dL (ref >39)
LDL Chol Calc (NIH): 122 mg/dL — ABNORMAL HIGH (ref 0–99)
Triglycerides: 139 mg/dL (ref 0–149)
VLDL Cholesterol Cal: 24 mg/dL (ref 5–40)

## 2023-04-01 LAB — BASIC METABOLIC PANEL
BUN/Creatinine Ratio: 15 (ref 12–28)
BUN: 15 mg/dL (ref 8–27)
CO2: 25 mmol/L (ref 20–29)
Calcium: 9.8 mg/dL (ref 8.7–10.3)
Chloride: 106 mmol/L (ref 96–106)
Creatinine, Ser: 1.03 mg/dL — ABNORMAL HIGH (ref 0.57–1.00)
Glucose: 194 mg/dL — ABNORMAL HIGH (ref 70–99)
Potassium: 3.8 mmol/L (ref 3.5–5.2)
Sodium: 146 mmol/L — ABNORMAL HIGH (ref 134–144)
eGFR: 56 mL/min/{1.73_m2} — ABNORMAL LOW (ref >59)

## 2023-04-01 LAB — HEMOGLOBIN A1C
Est. average glucose Bld gHb Est-mCnc: 171 mg/dL
Hgb A1c MFr Bld: 7.6 % — ABNORMAL HIGH (ref 4.8–5.6)

## 2023-04-10 ENCOUNTER — Telehealth: Payer: Self-pay

## 2023-04-10 DIAGNOSIS — Z2821 Immunization not carried out because of patient refusal: Secondary | ICD-10-CM | POA: Insufficient documentation

## 2023-04-10 DIAGNOSIS — Z5320 Procedure and treatment not carried out because of patient's decision for unspecified reasons: Secondary | ICD-10-CM | POA: Insufficient documentation

## 2023-04-10 NOTE — Assessment & Plan Note (Signed)
Hemoglobin A1c slightly elevated at last visit.  Advised to take medication regularly and discussed risk of poorly controlled diabetes.

## 2023-04-10 NOTE — Assessment & Plan Note (Signed)
Discussed risk of heart attack, stroke and renal failure would like to remain full code. States "I am going to start back on my medication"

## 2023-04-10 NOTE — Assessment & Plan Note (Signed)
She is encouraged to strive for BMI less than 30 to decrease cardiac risk. Advised to aim for at least 150 minutes of exercise per week.  

## 2023-04-10 NOTE — Progress Notes (Signed)
   Care Guide Note  04/10/2023 Name: Shadawn Schwering MRN: [id number] DOB: 26-Nov-1945  Referred by: Arnette Felts, FNP Reason for referral : Care Coordination (Outreach to schedule with Pharm d )   Zyiona Pancake is a 77 y.o. year old female who is a primary care patient of Arnette Felts, FNP. Lisamarie Debois was referred to the pharmacist for assistance related to HTN and DM.    An unsuccessful telephone outreach was attempted today to contact the patient who was referred to the pharmacy team for assistance with medication management. Additional attempts will be made to contact the patient.   [name], RMA Care Guide [facility]  [place], [place] [address] Direct Dial: [phone] [email or web address]

## 2023-04-10 NOTE — Assessment & Plan Note (Signed)
Cholesterol levels slightly elevated.  Encouraged to take her statin as directed.  And to follow a low-fat diet.

## 2023-04-10 NOTE — Assessment & Plan Note (Signed)
Declines covid 19 vaccine. Discussed risk of covid 19 and if she changes her mind about the vaccine to call the office. Education has been provided regarding the importance of this vaccine but patient still declined. Advised may receive this vaccine at local pharmacy or Health Dept.or vaccine clinic. Aware to provide a copy of the vaccination record if obtained from local pharmacy or Health Dept.  Encouraged to take multivitamin, vitamin d, vitamin c and zinc to increase immune system. Aware can call office if would like to have vaccine here at office. Verbalized acceptance and understanding.   

## 2023-04-10 NOTE — Assessment & Plan Note (Signed)
Blood pressure is elevated patient has not been taking her medications.  I have stressed the importance of adherence to medication regimen.  Explained risk of heart attack and strokes due to elevated blood pre.  Will refer to CCM for pharmacy assistance and nurse case manager.

## 2023-04-10 NOTE — Assessment & Plan Note (Signed)
Is encouraged to take a statin.  She has been declining in the past.

## 2023-04-10 NOTE — Assessment & Plan Note (Signed)
Will check vitamin D level and supplement as needed.    Also encouraged to spend 15 minutes in the sun daily.   

## 2023-04-29 NOTE — Progress Notes (Signed)
   Care Guide Note  04/29/2023 Name: Kristen Cannon MRN: [id number] DOB: 18-Jun-1946  Referred by: Arnette Felts, FNP Reason for referral : Care Coordination (Outreach to schedule with Pharm d )   [name] is a 77 y.o. year old female who is a primary care patient of Arnette Felts, FNP. Ginnie Marich was referred to the pharmacist for assistance related to DM.    A second unsuccessful telephone outreach was attempted today to contact the patient who was referred to the pharmacy team for assistance with medication management. Additional attempts will be made to contact the patient.  [name], RMA Care Guide [facility]  [place], [place] [address] Direct Dial: [phone] [email or web address]

## 2023-05-12 NOTE — Progress Notes (Signed)
   Care Guide Note  05/12/2023 Name: Kristen Cannon MRN: [id number] DOB: 1946-04-07  Referred by: Arnette Felts, FNP Reason for referral : Care Coordination (Outreach to schedule with Pharm d )   Kristen Cannon is a 77 y.o. year old female who is a primary care patient of Arnette Felts, FNP. [name] was referred to the pharmacist for assistance related to DM.    A third unsuccessful telephone outreach was attempted today to contact the patient who was referred to the pharmacy team for assistance with medication management. The Population Health team is pleased to engage with this patient at any time in the future upon receipt of referral and should he/she be interested in assistance from the [facility] team.   [name], RMA Care Guide [facility]  [place], [place] [address] Direct Dial: [phone] [name]

## 2023-07-24 DIAGNOSIS — Z947 Corneal transplant status: Secondary | ICD-10-CM | POA: Diagnosis not present

## 2023-07-24 DIAGNOSIS — H04123 Dry eye syndrome of bilateral lacrimal glands: Secondary | ICD-10-CM | POA: Diagnosis not present

## 2023-07-24 DIAGNOSIS — H40003 Preglaucoma, unspecified, bilateral: Secondary | ICD-10-CM | POA: Diagnosis not present

## 2023-07-24 DIAGNOSIS — H16142 Punctate keratitis, left eye: Secondary | ICD-10-CM | POA: Diagnosis not present

## 2023-07-24 DIAGNOSIS — Z961 Presence of intraocular lens: Secondary | ICD-10-CM | POA: Diagnosis not present

## 2023-07-24 LAB — HM DIABETES EYE EXAM

## 2023-08-06 ENCOUNTER — Encounter: Payer: Self-pay | Admitting: Nurse Practitioner

## 2023-08-06 ENCOUNTER — Ambulatory Visit: Payer: 59 | Admitting: Nurse Practitioner

## 2023-08-06 VITALS — BP 180/100 | HR 89 | Temp 99.2°F | Ht <= 58 in | Wt 153.4 lb

## 2023-08-06 DIAGNOSIS — E6609 Other obesity due to excess calories: Secondary | ICD-10-CM

## 2023-08-06 DIAGNOSIS — E1122 Type 2 diabetes mellitus with diabetic chronic kidney disease: Secondary | ICD-10-CM | POA: Diagnosis not present

## 2023-08-06 DIAGNOSIS — I700 Atherosclerosis of aorta: Secondary | ICD-10-CM | POA: Diagnosis not present

## 2023-08-06 DIAGNOSIS — F321 Major depressive disorder, single episode, moderate: Secondary | ICD-10-CM

## 2023-08-06 DIAGNOSIS — Z79899 Other long term (current) drug therapy: Secondary | ICD-10-CM | POA: Diagnosis not present

## 2023-08-06 DIAGNOSIS — E66811 Obesity, class 1: Secondary | ICD-10-CM

## 2023-08-06 DIAGNOSIS — I129 Hypertensive chronic kidney disease with stage 1 through stage 4 chronic kidney disease, or unspecified chronic kidney disease: Secondary | ICD-10-CM | POA: Diagnosis not present

## 2023-08-06 DIAGNOSIS — N1831 Chronic kidney disease, stage 3a: Secondary | ICD-10-CM

## 2023-08-06 DIAGNOSIS — R634 Abnormal weight loss: Secondary | ICD-10-CM

## 2023-08-06 DIAGNOSIS — Z0000 Encounter for general adult medical examination without abnormal findings: Secondary | ICD-10-CM | POA: Diagnosis not present

## 2023-08-06 DIAGNOSIS — Z6830 Body mass index (BMI) 30.0-30.9, adult: Secondary | ICD-10-CM

## 2023-08-06 DIAGNOSIS — E7800 Pure hypercholesterolemia, unspecified: Secondary | ICD-10-CM | POA: Diagnosis not present

## 2023-08-06 DIAGNOSIS — Z23 Encounter for immunization: Secondary | ICD-10-CM | POA: Diagnosis not present

## 2023-08-06 DIAGNOSIS — F329 Major depressive disorder, single episode, unspecified: Secondary | ICD-10-CM

## 2023-08-06 DIAGNOSIS — Z91199 Patient's noncompliance with other medical treatment and regimen due to unspecified reason: Secondary | ICD-10-CM

## 2023-08-06 LAB — POCT URINALYSIS DIP (CLINITEK)
Bilirubin, UA: NEGATIVE
Blood, UA: NEGATIVE
Glucose, UA: NEGATIVE mg/dL
Ketones, POC UA: NEGATIVE mg/dL
Leukocytes, UA: NEGATIVE
Nitrite, UA: NEGATIVE
POC PROTEIN,UA: 100 — AB
Spec Grav, UA: 1.025 (ref 1.010–1.025)
Urobilinogen, UA: 0.2 U/dL
pH, UA: 6 (ref 5.0–8.0)

## 2023-08-06 MED ORDER — OLMESARTAN MEDOXOMIL 20 MG PO TABS: 20.0000 mg | ORAL_TABLET | Freq: Every day | ORAL | Status: DC

## 2023-08-06 MED ORDER — CITALOPRAM HYDROBROMIDE 10 MG PO TABS: ORAL_TABLET | ORAL | 2 refills | Status: DC

## 2023-08-06 MED ORDER — AMLODIPINE BESYLATE 5 MG PO TABS: ORAL_TABLET | ORAL | Status: DC

## 2023-08-06 NOTE — Progress Notes (Signed)
Kristen Cannon, CMA,acting as a Neurosurgeon for Kristen Felts, FNP.,have documented all relevant documentation on the behalf of Kristen Felts, FNP,as directed by  Kristen Felts, FNP while in the presence of Kristen Felts, FNP.  Subjective:    Patient ID: Kristen Cannon , female    DOB: [birth year] , 77 y.o.   MRN: [id number]  Chief Complaint  Patient presents with   Annual Exam    HPI  Patient presents today for HM, Patient admits to not taking medication. Patient denies any chest pain, SOB, or headaches. Patient reports she wants to know why she is losing so much weight. She sometimes does not have an appetite.   BP Readings from Last 3 Encounters: 08/06/23 : (!) 170/90 03/31/23 : (!) 170/90 01/21/23 : (!) 142/68   Wt Readings from Last 3 Encounters: 08/06/23 : 153 lb 6.4 oz (69.6 kg) 03/31/23 : 157 lb 6.4 oz (71.4 kg) 01/21/23 : 153 lb (69.4 kg)  Her granddaughter passed this year in July after having a brain aneurysm. She has not had motivation to take her medications.       Past Medical History:  Diagnosis Date   Diabetes mellitus    High cholesterol    Hypertension      Family History  Problem Relation Age of Onset   Breast cancer Sister    Breast cancer Daughter    Early death Mother    Early death Father      Current Outpatient Medications:    citalopram (CELEXA) 10 MG tablet, Take 1 tablet by mouth at bedtime, Disp: 30 tablet, Rfl: 2   amLODipine (NORVASC) 5 MG tablet, Take 1 tablet by mouth at bedtime, Disp: , Rfl:    aspirin EC 81 MG tablet, Take 81 mg by mouth every morning. (Patient not taking: Reported on 08/06/2023), Disp: , Rfl:    atorvastatin (LIPITOR) 20 MG tablet, Take 1 tablet by mouth at bedtime (Patient not taking: Reported on 08/06/2023), Disp: 90 tablet, Rfl: 1   glipiZIDE (GLUCOTROL) 10 MG tablet, TAKE 1 TABLET(10 MG) BY MOUTH TWICE DAILY (Patient not taking: Reported on 08/06/2023), Disp: 180 tablet, Rfl: 1   olmesartan (BENICAR) 20 MG  tablet, Take 1 tablet (20 mg total) by mouth daily. In morning, Disp: , Rfl:    No Known Allergies    The patient states she uses status post hysterectomy.  No LMP recorded. Patient has had a hysterectomy.  Negative for: breast discharge, breast lump(s), breast pain and breast self exam. Associated symptoms include abnormal vaginal bleeding. Pertinent negatives include abnormal bleeding (hematology), anxiety, decreased libido, depression, difficulty falling sleep, dyspareunia, history of infertility, nocturia, sexual dysfunction, sleep disturbances, urinary incontinence, urinary urgency, vaginal discharge and vaginal itching. Diet regular. The patient states her exercise level is minimal - reports she walks throughout the clinic.  The patient's tobacco use is:  Social History   Tobacco Use  Smoking Status Former   Current packs/day: 0.00   Average packs/day: 1 pack/day for 12.0 years (12.0 ttl pk-yrs)   Types: Cigars, Cigarettes   Start date: 12   Quit date: 2007   Years since quitting: 17.8  Smokeless Tobacco Never  Tobacco Comments   1 cigar a week   She has been exposed to passive smoke. The patient's alcohol use is:  Social History   Substance and Sexual Activity  Alcohol Use Never    Review of Systems  Constitutional: Negative.   HENT: Negative.    Eyes: Negative.   Respiratory:  Negative.    Cardiovascular: Negative.   Gastrointestinal: Negative.   Endocrine: Negative.   Genitourinary: Negative.   Musculoskeletal: Negative.   Skin: Negative.   Allergic/Immunologic: Negative.   Neurological:  Negative for dizziness.       Has been having episodes of her balance being off regularly  Hematological: Negative.   Psychiatric/Behavioral: Negative.         Depressed mood     Today's Vitals   08/06/23 1015 08/06/23 1031  BP: (!) 170/90 (!) 180/100  Pulse: 89   Temp: 99.2 F (37.3 C)   TempSrc: Oral   Weight: 153 lb 6.4 oz (69.6 kg)   Height: 4\' 9"  (1.448 m)    PainSc: 0-No pain    Body mass index is 33.2 kg/m.  Wt Readings from Last 3 Encounters:  08/06/23 153 lb 6.4 oz (69.6 kg)  03/31/23 157 lb 6.4 oz (71.4 kg)  01/21/23 153 lb (69.4 kg)       08/06/2023   10:12 AM 03/31/2023    2:56 PM 01/21/2023    2:50 PM 01/21/2023   10:23 AM 11/04/2022    3:04 PM  Depression screen PHQ 2/9  Decreased Interest 3 0 0 0 0  Down, Depressed, Hopeless 1 0 0 0 0  PHQ - 2 Score 4 0 0 0 0  Altered sleeping 1 0   0  Tired, decreased energy 2 0   0  Change in appetite 3 0   0  Feeling bad or failure about yourself  1 0   0  Trouble concentrating 3 0   0  Moving slowly or fidgety/restless 0 0   0  Suicidal thoughts 0 0   0  PHQ-9 Score 14 0   0  Difficult doing work/chores Very difficult Not difficult at all   Not difficult at all     Objective:  Physical Exam Vitals reviewed.  Constitutional:      General: She is not in acute distress.    Appearance: Normal appearance. She is well-developed.  HENT:     Head: Normocephalic and atraumatic.     Right Ear: Hearing, tympanic membrane, ear canal and external ear normal. There is no impacted cerumen.     Left Ear: Hearing, tympanic membrane, ear canal and external ear normal. There is no impacted cerumen.     Nose: Nose normal.     Mouth/Throat:     Mouth: Mucous membranes are moist.  Eyes:     General: Lids are normal.     Extraocular Movements: Extraocular movements intact.     Conjunctiva/sclera: Conjunctivae normal.     Pupils: Pupils are equal, round, and reactive to light.     Funduscopic exam:    Right eye: No papilledema.        Left eye: No papilledema.  Neck:     Thyroid: No thyroid mass.     Vascular: No carotid bruit.  Cardiovascular:     Rate and Rhythm: Normal rate and regular rhythm.     Pulses: Normal pulses.     Heart sounds: Normal heart sounds. No murmur heard. Pulmonary:     Effort: Pulmonary effort is normal.     Breath sounds: Normal breath sounds.  Chest:     Chest  wall: No mass.  Breasts:    Tanner Score is 5.     Breasts are symmetrical.     Right: Normal. No tenderness.     Left: No tenderness.  Abdominal:  General: Abdomen is flat. Bowel sounds are normal. There is no distension.     Palpations: Abdomen is soft.     Tenderness: There is no abdominal tenderness.  Genitourinary:    Rectum: Guaiac result negative.  Musculoskeletal:        General: No swelling or tenderness. Normal range of motion.     Cervical back: Full passive range of motion without pain, normal range of motion and neck supple.     Right lower leg: No edema.     Left lower leg: No edema.  Lymphadenopathy:     Upper Body:     Right upper body: No supraclavicular or axillary adenopathy.     Left upper body: No supraclavicular or axillary adenopathy.  Skin:    General: Skin is warm and dry.     Capillary Refill: Capillary refill takes less than 2 seconds.  Neurological:     General: No focal deficit present.     Mental Status: She is alert and oriented to person, place, and time.     Cranial Nerves: No cranial nerve deficit.     Sensory: No sensory deficit.     Motor: No weakness.  Psychiatric:        Mood and Affect: Mood normal.        Behavior: Behavior normal.        Thought Content: Thought content normal.        Judgment: Judgment normal.     Assessment And Plan:     Encounter for annual health examination Assessment & Plan: Behavior modifications discussed and diet history reviewed.   Pt will continue to exercise regularly and modify diet with low GI, plant based foods and decrease intake of processed foods.  Recommend intake of daily multivitamin, Vitamin D, and calcium.  Recommend mammogram and colonoscopy for preventive screenings, as well as recommend immunizations that include influenza, TDAP, and Shingles    Type 2 diabetes mellitus with stage 3a chronic kidney disease, without long-term current use of insulin (HCC) Assessment &  Plan: Hemoglobin A1c was increased at last visit.  Patient has not been taking any of her medications I have stressed the importance of compliance with her medications and risk for microvascular complications related to poorly controlled diabetes.  She states today that she will start back taking her medication.  Will restart her on oral agents and hold off on her Ozempic at this time.  Diabetic foot exam done without any abnormal findings.  Orders: -     EKG 12-Lead -     POCT URINALYSIS DIP (CLINITEK) -     Microalbumin / creatinine urine ratio -     CMP14+EGFR -     Hemoglobin A1c  Pure hypercholesterolemia Assessment & Plan: Cholesterol levels are stable, will recheck lipid panel.  She is planning to restart her medications.  Encouraged to eat a low-fat diet.  Orders: -     CMP14+EGFR -     Lipid panel  Hypertensive nephropathy Assessment & Plan: Blood pressure is elevated even with the repeat.  Patient has not been taking her medications as directed.  Explained risk for stroke or heart attack with poorly controlled hypertension.  Verbalizes understanding.  Orders: -     CMP14+EGFR -     amLODIPine Besylate; Take 1 tablet by mouth at bedtime -     Olmesartan Medoxomil; Take 1 tablet (20 mg total) by mouth daily. In morning  Need for influenza vaccination Assessment & Plan: Influenza vaccine administered  Encouraged to take Tylenol as needed for fever or muscle aches.   Orders: -     Flu Vaccine Trivalent High Dose (Fluad)  COVID-19 vaccine administered -     Pfizer Comirnaty Covid-19 Vaccine 45yrs & older  Other long term (current) drug therapy -     CBC with Differential/Platelet  Noncompliance with treatment regimen Assessment & Plan: I had a long discussion with her about taking her medications as directed and getting her under better control with her diabetes and hypertension.  She expressed that she may be having some depression and causing her to not want to take  her medications and not being able to communicate with her children about important health issues in plans for her if something were to happen to her.   Abnormal weight loss -     CT ABDOMEN PELVIS WO CONTRAST; Future -     TSH -     Iron, TIBC and Ferritin Panel -     Vitamin B12  Current moderate episode of major depressive disorder without prior episode (HCC) -     Citalopram Hydrobromide; Take 1 tablet by mouth at bedtime  Dispense: 30 tablet; Refill: 2  Atherosclerosis of aorta (HCC) Assessment & Plan: Continue statin, tolerating well.   Class 1 obesity due to excess calories with serious comorbidity and body mass index (BMI) of 30.0 to 30.9 in adult Assessment & Plan: She is encouraged to strive for BMI less than 30 to decrease cardiac risk. Advised to aim for at least 150 minutes of exercise per week.    Reactive depression Assessment & Plan: Her diet depression screening score is a 14.  This may be related to her inability to be able to communicate with her family about what she would like done if she were to get sick.  I have expressed to her that we can send her to counseling however she would like to wait and see what happens with the medications.  She is to follow-up in 4 to 6 weeks.      Return for 1 year physical, controlled DM check 4 months. Patient was given opportunity to ask questions. Patient verbalized understanding of the plan and was able to repeat key elements of the plan. All questions were answered to their satisfaction.   Kristen Felts, FNP  I, Kristen Felts, FNP, have reviewed all documentation for this visit. The documentation on 08/06/23 for the exam, diagnosis, procedures, and orders are all accurate and complete.

## 2023-08-07 LAB — CBC WITH DIFFERENTIAL/PLATELET
Basophils Absolute: 0 10*3/uL (ref 0.0–0.2)
Basos: 0 %
EOS (ABSOLUTE): 0.1 10*3/uL (ref 0.0–0.4)
Eos: 3 %
Hematocrit: 36.6 % (ref 34.0–46.6)
Hemoglobin: 11.6 g/dL (ref 11.1–15.9)
Immature Grans (Abs): 0 10*3/uL (ref 0.0–0.1)
Immature Granulocytes: 0 %
Lymphocytes Absolute: 2.3 10*3/uL (ref 0.7–3.1)
Lymphs: 51 %
MCH: 28 pg (ref 26.6–33.0)
MCHC: 31.7 g/dL (ref 31.5–35.7)
MCV: 88 fL (ref 79–97)
Monocytes Absolute: 0.4 10*3/uL (ref 0.1–0.9)
Monocytes: 10 %
Neutrophils Absolute: 1.6 10*3/uL (ref 1.4–7.0)
Neutrophils: 36 %
Platelets: 253 10*3/uL (ref 150–450)
RBC: 4.14 x10E6/uL (ref 3.77–5.28)
RDW: 13 % (ref 11.7–15.4)
WBC: 4.5 10*3/uL (ref 3.4–10.8)

## 2023-08-07 LAB — CMP14+EGFR
ALT: 19 [IU]/L (ref 0–32)
AST: 27 [IU]/L (ref 0–40)
Albumin: 4 g/dL (ref 3.8–4.8)
Alkaline Phosphatase: 111 [IU]/L (ref 44–121)
BUN/Creatinine Ratio: 16 (ref 12–28)
BUN: 16 mg/dL (ref 8–27)
Bilirubin Total: 0.3 mg/dL (ref 0.0–1.2)
CO2: 21 mmol/L (ref 20–29)
Calcium: 9.7 mg/dL (ref 8.7–10.3)
Chloride: 107 mmol/L — ABNORMAL HIGH (ref 96–106)
Creatinine, Ser: 1.02 mg/dL — ABNORMAL HIGH (ref 0.57–1.00)
Globulin, Total: 3.1 g/dL (ref 1.5–4.5)
Glucose: 125 mg/dL — ABNORMAL HIGH (ref 70–99)
Potassium: 3.7 mmol/L (ref 3.5–5.2)
Sodium: 145 mmol/L — ABNORMAL HIGH (ref 134–144)
Total Protein: 7.1 g/dL (ref 6.0–8.5)
eGFR: 56 mL/min/{1.73_m2} — ABNORMAL LOW (ref >59)

## 2023-08-07 LAB — IRON,TIBC AND FERRITIN PANEL
Ferritin: 170 ng/mL — ABNORMAL HIGH (ref 15–150)
Iron Saturation: 19 % (ref 15–55)
Iron: 60 ug/dL (ref 27–139)
Total Iron Binding Capacity: 324 ug/dL (ref 250–450)
UIBC: 264 ug/dL (ref 118–369)

## 2023-08-07 LAB — MICROALBUMIN / CREATININE URINE RATIO
Creatinine, Urine: 62.5 mg/dL
Microalb/Creat Ratio: 563 mg/g{creat} — ABNORMAL HIGH (ref 0–29)
Microalbumin, Urine: 351.6 ug/mL

## 2023-08-07 LAB — LIPID PANEL
Chol/HDL Ratio: 2.8 ratio (ref 0.0–4.4)
Cholesterol, Total: 213 mg/dL — ABNORMAL HIGH (ref 100–199)
HDL: 77 mg/dL (ref >39)
LDL Chol Calc (NIH): 118 mg/dL — ABNORMAL HIGH (ref 0–99)
Triglycerides: 102 mg/dL (ref 0–149)
VLDL Cholesterol Cal: 18 mg/dL (ref 5–40)

## 2023-08-07 LAB — HEMOGLOBIN A1C
Est. average glucose Bld gHb Est-mCnc: 177 mg/dL
Hgb A1c MFr Bld: 7.8 % — ABNORMAL HIGH (ref 4.8–5.6)

## 2023-08-07 LAB — TSH: TSH: 2.43 u[IU]/mL (ref 0.450–4.500)

## 2023-08-07 LAB — VITAMIN B12: Vitamin B-12: 520 pg/mL (ref 232–1245)

## 2023-08-11 ENCOUNTER — Ambulatory Visit: Payer: 59 | Admitting: Nurse Practitioner

## 2023-08-11 ENCOUNTER — Encounter: Payer: Self-pay | Admitting: Nurse Practitioner

## 2023-08-12 DIAGNOSIS — F329 Major depressive disorder, single episode, unspecified: Secondary | ICD-10-CM | POA: Insufficient documentation

## 2023-08-12 NOTE — Assessment & Plan Note (Signed)
Continue statin, tolerating well 

## 2023-08-12 NOTE — Assessment & Plan Note (Signed)

## 2023-08-12 NOTE — Assessment & Plan Note (Signed)
She is encouraged to strive for BMI less than 30 to decrease cardiac risk. Advised to aim for at least 150 minutes of exercise per week.

## 2023-08-12 NOTE — Assessment & Plan Note (Signed)
Her diet depression screening score is a 14.  This may be related to her inability to be able to communicate with her family about what she would like done if she were to get sick.  I have expressed to her that we can send her to counseling however she would like to wait and see what happens with the medications.  She is to follow-up in 4 to 6 weeks.

## 2023-08-12 NOTE — Assessment & Plan Note (Signed)
Cholesterol levels are stable, will recheck lipid panel.  She is planning to restart her medications.  Encouraged to eat a low-fat diet.

## 2023-08-12 NOTE — Assessment & Plan Note (Signed)
Hemoglobin A1c was increased at last visit.  Patient has not been taking any of her medications I have stressed the importance of compliance with her medications and risk for microvascular complications related to poorly controlled diabetes.  She states today that she will start back taking her medication.  Will restart her on oral agents and hold off on her Ozempic at this time.  Diabetic foot exam done without any abnormal findings.

## 2023-08-12 NOTE — Assessment & Plan Note (Signed)
Influenza vaccine administered Encouraged to take Tylenol as needed for fever or muscle aches.

## 2023-08-12 NOTE — Assessment & Plan Note (Signed)
Blood pressure is elevated even with the repeat.  Patient has not been taking her medications as directed.  Explained risk for stroke or heart attack with poorly controlled hypertension.  Verbalizes understanding.

## 2023-08-12 NOTE — Assessment & Plan Note (Signed)
I had a long discussion with her about taking her medications as directed and getting her under better control with her diabetes and hypertension.  She expressed that she may be having some depression and causing her to not want to take her medications and not being able to communicate with her children about important health issues in plans for her if something were to happen to her.

## 2023-09-24 ENCOUNTER — Other Ambulatory Visit: Payer: Self-pay | Admitting: Nurse Practitioner

## 2023-09-24 DIAGNOSIS — N1831 Chronic kidney disease, stage 3a: Secondary | ICD-10-CM

## 2023-10-26 DIAGNOSIS — E1122 Type 2 diabetes mellitus with diabetic chronic kidney disease: Secondary | ICD-10-CM | POA: Diagnosis not present

## 2023-10-26 DIAGNOSIS — N1831 Chronic kidney disease, stage 3a: Secondary | ICD-10-CM | POA: Diagnosis not present

## 2023-10-26 DIAGNOSIS — E785 Hyperlipidemia, unspecified: Secondary | ICD-10-CM | POA: Diagnosis not present

## 2023-10-26 DIAGNOSIS — I129 Hypertensive chronic kidney disease with stage 1 through stage 4 chronic kidney disease, or unspecified chronic kidney disease: Secondary | ICD-10-CM | POA: Diagnosis not present

## 2023-11-06 DIAGNOSIS — H04123 Dry eye syndrome of bilateral lacrimal glands: Secondary | ICD-10-CM | POA: Diagnosis not present

## 2023-11-06 DIAGNOSIS — Z947 Corneal transplant status: Secondary | ICD-10-CM | POA: Diagnosis not present

## 2023-11-06 DIAGNOSIS — H18232 Secondary corneal edema, left eye: Secondary | ICD-10-CM | POA: Diagnosis not present

## 2023-11-06 DIAGNOSIS — H16142 Punctate keratitis, left eye: Secondary | ICD-10-CM | POA: Diagnosis not present

## 2023-11-09 DIAGNOSIS — N1831 Chronic kidney disease, stage 3a: Secondary | ICD-10-CM | POA: Diagnosis not present

## 2023-11-27 DIAGNOSIS — H16142 Punctate keratitis, left eye: Secondary | ICD-10-CM | POA: Diagnosis not present

## 2023-11-27 DIAGNOSIS — Z947 Corneal transplant status: Secondary | ICD-10-CM | POA: Diagnosis not present

## 2023-11-27 DIAGNOSIS — H04123 Dry eye syndrome of bilateral lacrimal glands: Secondary | ICD-10-CM | POA: Diagnosis not present

## 2023-11-27 DIAGNOSIS — E119 Type 2 diabetes mellitus without complications: Secondary | ICD-10-CM | POA: Diagnosis not present

## 2023-11-27 DIAGNOSIS — H40003 Preglaucoma, unspecified, bilateral: Secondary | ICD-10-CM | POA: Diagnosis not present

## 2023-11-27 DIAGNOSIS — Z961 Presence of intraocular lens: Secondary | ICD-10-CM | POA: Diagnosis not present

## 2023-12-07 ENCOUNTER — Ambulatory Visit: Payer: 59 | Admitting: Nurse Practitioner

## 2023-12-07 NOTE — Progress Notes (Deleted)
 Madelaine Bhat, CMA,acting as a Neurosurgeon for Arnette Felts, FNP.,have documented all relevant documentation on the behalf of Arnette Felts, FNP,as directed by  Arnette Felts, FNP while in the presence of Arnette Felts, FNP.  Subjective:  Patient ID: Kristen Cannon , female    DOB: [birth year] , 78 y.o.   MRN: [id number]  No chief complaint on file.   HPI  Patient presents today for a chol and dm follow up, Patient reports compliance with medication. Patient denies any chest pain, SOB, or headaches. Patient has no concerns today.     Past Medical History:  Diagnosis Date  . Diabetes mellitus   . High cholesterol   . Hypertension      Family History  Problem Relation Age of Onset  . Breast cancer Sister   . Breast cancer Daughter   . Early death Mother   . Early death Father      Current Outpatient Medications:  .  amLODipine (NORVASC) 5 MG tablet, Take 1 tablet by mouth at bedtime, Disp: , Rfl:  .  aspirin EC 81 MG tablet, Take 81 mg by mouth every morning. (Patient not taking: Reported on 08/06/2023), Disp: , Rfl:  .  atorvastatin (LIPITOR) 20 MG tablet, Take 1 tablet by mouth at bedtime (Patient not taking: Reported on 08/06/2023), Disp: 90 tablet, Rfl: 1 .  citalopram (CELEXA) 10 MG tablet, Take 1 tablet by mouth at bedtime, Disp: 30 tablet, Rfl: 2 .  glipiZIDE (GLUCOTROL) 10 MG tablet, TAKE 1 TABLET(10 MG) BY MOUTH TWICE DAILY, Disp: 180 tablet, Rfl: 1 .  olmesartan (BENICAR) 20 MG tablet, Take 1 tablet (20 mg total) by mouth daily. In morning, Disp: , Rfl:    No Known Allergies   Review of Systems   There were no vitals filed for this visit. There is no height or weight on file to calculate BMI.  Wt Readings from Last 3 Encounters:  08/06/23 153 lb 6.4 oz (69.6 kg)  03/31/23 157 lb 6.4 oz (71.4 kg)  01/21/23 153 lb (69.4 kg)    The 10-year ASCVD risk score (Arnett DK, et al., 2019) is: 61.6%   Values used to calculate the score:     Age: 48 years     Sex:  Female     Is Non-Hispanic African American: Yes     Diabetic: Yes     Tobacco smoker: No     Systolic Blood Pressure: 180 mmHg     Is BP treated: Yes     HDL Cholesterol: 77 mg/dL     Total Cholesterol: 213 mg/dL  Objective:  Physical Exam      Assessment And Plan:  Type 2 diabetes mellitus with stage 3a chronic kidney disease, without long-term current use of insulin (HCC)  Pure hypercholesterolemia    No follow-ups on file.  Patient was given opportunity to ask questions. Patient verbalized understanding of the plan and was able to repeat key elements of the plan. All questions were answered to their satisfaction.    Jeanell Sparrow, FNP, have reviewed all documentation for this visit. The documentation on 12/07/23 for the exam, diagnosis, procedures, and orders are all accurate and complete.   IF YOU HAVE BEEN REFERRED TO A SPECIALIST, IT MAY TAKE 1-2 WEEKS TO SCHEDULE/PROCESS THE REFERRAL. IF YOU HAVE NOT HEARD FROM US/SPECIALIST IN TWO WEEKS, PLEASE GIVE [place] A CALL AT [phone].

## 2023-12-21 ENCOUNTER — Ambulatory Visit: Payer: 59 | Admitting: Nurse Practitioner

## 2023-12-21 ENCOUNTER — Encounter: Payer: Self-pay | Admitting: Nurse Practitioner

## 2023-12-21 VITALS — BP 110/70 | HR 85 | Temp 98.3°F | Ht <= 58 in | Wt 161.8 lb

## 2023-12-21 DIAGNOSIS — E1122 Type 2 diabetes mellitus with diabetic chronic kidney disease: Secondary | ICD-10-CM | POA: Diagnosis not present

## 2023-12-21 DIAGNOSIS — E7800 Pure hypercholesterolemia, unspecified: Secondary | ICD-10-CM

## 2023-12-21 DIAGNOSIS — Z6835 Body mass index (BMI) 35.0-35.9, adult: Secondary | ICD-10-CM

## 2023-12-21 DIAGNOSIS — I129 Hypertensive chronic kidney disease with stage 1 through stage 4 chronic kidney disease, or unspecified chronic kidney disease: Secondary | ICD-10-CM

## 2023-12-21 DIAGNOSIS — Z23 Encounter for immunization: Secondary | ICD-10-CM | POA: Diagnosis not present

## 2023-12-21 DIAGNOSIS — N1831 Chronic kidney disease, stage 3a: Secondary | ICD-10-CM

## 2023-12-21 NOTE — Patient Instructions (Signed)
 Health Maintenance  Topic Date Due   Medicare Annual Wellness Visit  01/21/2024   Hemoglobin A1C  02/04/2024   COVID-19 Vaccine (7 - 2024-25 season) 02/15/2024   Eye exam for diabetics  07/23/2024   Yearly kidney function blood test for diabetes  08/05/2024   Yearly kidney health urinalysis for diabetes  08/05/2024   Complete foot exam   08/05/2024   DTaP/Tdap/Td vaccine (2 - Td or Tdap) 01/09/2028   Pneumonia Vaccine  Completed   Flu Shot  Completed   DEXA scan (bone density measurement)  Completed   Hepatitis C Screening  Completed   Zoster (Shingles) Vaccine  Completed   HPV Vaccine  Aged Out  YOU ARE DOING GREAT I AM PROUD OF YOU!!

## 2023-12-21 NOTE — Progress Notes (Signed)
 Madelaine Bhat, CMA,acting as a Neurosurgeon for Arnette Felts, FNP.,have documented all relevant documentation on the behalf of Arnette Felts, FNP,as directed by  Arnette Felts, FNP while in the presence of Arnette Felts, FNP.  Subjective:  Patient ID: Kristen Cannon , female    DOB: 05/04/1946 , 78 y.o.   MRN: [id number]  Chief Complaint  Patient presents with   Diabetes    HPI  Patient presents today for a chol and dm follow up, Patient reports compliance with medication. Patient denies any chest pain, SOB, or headaches. Patient has no concerns today.      Past Medical History:  Diagnosis Date   Diabetes mellitus    High cholesterol    Hypertension      Family History  Problem Relation Age of Onset   Breast cancer Sister    Breast cancer Daughter    Early death Mother    Early death Father      Current Outpatient Medications:    citalopram (CELEXA) 10 MG tablet, Take 1 tablet by mouth at bedtime, Disp: 30 tablet, Rfl: 2   glipiZIDE (GLUCOTROL) 10 MG tablet, TAKE 1 TABLET(10 MG) BY MOUTH TWICE DAILY, Disp: 180 tablet, Rfl: 1   Olmesartan-amLODIPine-HCTZ 40-5-25 MG TABS, Take 1 tablet by mouth daily., Disp: , Rfl:    amLODipine (NORVASC) 5 MG tablet, Take 1 tablet by mouth at bedtime (Patient not taking: Reported on 12/21/2023), Disp: , Rfl:    aspirin EC 81 MG tablet, Take 81 mg by mouth every morning. (Patient not taking: Reported on 08/06/2023), Disp: , Rfl:    atorvastatin (LIPITOR) 20 MG tablet, Take 1 tablet by mouth at bedtime (Patient not taking: Reported on 12/21/2023), Disp: 90 tablet, Rfl: 1   olmesartan (BENICAR) 20 MG tablet, Take 1 tablet (20 mg total) by mouth daily. In morning (Patient not taking: Reported on 12/21/2023), Disp: , Rfl:    No Known Allergies   Review of Systems  Constitutional: Negative.  Negative for fatigue.  Respiratory: Negative.  Negative for shortness of breath.   Cardiovascular: Negative.  Negative for chest pain and palpitations.   Gastrointestinal: Negative.   Endocrine: Negative for polydipsia, polyphagia and polyuria.  Neurological: Negative.  Negative for dizziness and headaches.  Psychiatric/Behavioral: Negative.       Today's Vitals   12/21/23 1447  BP: 110/70  Pulse: 85  Temp: 98.3 F (36.8 C)  TempSrc: Oral  Weight: 161 lb 12.8 oz (73.4 kg)  Height: 4\' 9"  (1.448 m)  PainSc: 0-No pain   Body mass index is 35.01 kg/m.  Wt Readings from Last 3 Encounters:  12/21/23 161 lb 12.8 oz (73.4 kg)  08/06/23 153 lb 6.4 oz (69.6 kg)  03/31/23 157 lb 6.4 oz (71.4 kg)      Objective:  Physical Exam Vitals and nursing note reviewed.  Constitutional:      General: She is not in acute distress.    Appearance: Normal appearance. She is obese.  Cardiovascular:     Rate and Rhythm: Normal rate and regular rhythm.     Pulses: Normal pulses.     Heart sounds: Normal heart sounds. No murmur heard. Pulmonary:     Effort: Pulmonary effort is normal. No respiratory distress.     Breath sounds: Normal breath sounds. No wheezing.  Abdominal:     General: Abdomen is flat. Bowel sounds are normal.     Palpations: Abdomen is soft.  Musculoskeletal:     Cervical back: Normal range of motion  and neck supple.  Skin:    General: Skin is warm and dry.     Capillary Refill: Capillary refill takes less than 2 seconds.  Neurological:     General: No focal deficit present.     Mental Status: She is alert and oriented to person, place, and time.     Cranial Nerves: No cranial nerve deficit.     Motor: No weakness.  Psychiatric:        Mood and Affect: Mood normal.        Behavior: Behavior normal.        Thought Content: Thought content normal.        Judgment: Judgment normal.      Assessment And Plan:  Type 2 diabetes mellitus with stage 3a chronic kidney disease, without long-term current use of insulin (HCC) Assessment & Plan: Chronic, she has been taking her medications more regularly. Will check HgbA1c.    Orders: -     Hemoglobin A1c -     BMP8+eGFR  Pure hypercholesterolemia Assessment & Plan: Was increased at last visit. Continue current medications. Will check lipid panel.   Orders: -     Lipid panel  Hypertensive nephropathy Assessment & Plan: Blood pressure is much better, I did congratulate her on the improvement. Continue current medications. Will check eGFR  Orders: -     BMP8+eGFR  COVID-19 vaccine administered Assessment & Plan: Covid 19 vaccine given in office observed for 15 minutes without any adverse reaction   Orders: Best boy Vaccine 6yrs & older  Obesity, morbid (HCC) Assessment & Plan: She is encouraged to strive for BMI less than 30 to decrease cardiac risk. Advised to aim for at least 150 minutes of exercise per week as tolerate, also explained can walk for her exercise   BMI 35.0-35.9,adult   Return for controlled DM check 4 months and HM.  Patient was given opportunity to ask questions. Patient verbalized understanding of the plan and was able to repeat key elements of the plan. All questions were answered to their satisfaction.    Jeanell Sparrow, FNP, have reviewed all documentation for this visit. The documentation on 12/21/23 for the exam, diagnosis, procedures, and orders are all accurate and complete.   IF YOU HAVE BEEN REFERRED TO A SPECIALIST, IT MAY TAKE 1-2 WEEKS TO SCHEDULE/PROCESS THE REFERRAL. IF YOU HAVE NOT HEARD FROM US/SPECIALIST IN TWO WEEKS, PLEASE GIVE [place] A CALL AT [phone] X [phone].

## 2023-12-22 LAB — BMP8+EGFR
BUN/Creatinine Ratio: 25 (ref 12–28)
BUN: 32 mg/dL — ABNORMAL HIGH (ref 8–27)
CO2: 23 mmol/L (ref 20–29)
Calcium: 10.2 mg/dL (ref 8.7–10.3)
Chloride: 106 mmol/L (ref 96–106)
Creatinine, Ser: 1.28 mg/dL — ABNORMAL HIGH (ref 0.57–1.00)
Glucose: 132 mg/dL — ABNORMAL HIGH (ref 70–99)
Potassium: 4.4 mmol/L (ref 3.5–5.2)
Sodium: 143 mmol/L (ref 134–144)
eGFR: 43 mL/min/{1.73_m2} — ABNORMAL LOW (ref >59)

## 2023-12-22 LAB — HEMOGLOBIN A1C
Est. average glucose Bld gHb Est-mCnc: 163 mg/dL
Hgb A1c MFr Bld: 7.3 % — ABNORMAL HIGH (ref 4.8–5.6)

## 2023-12-22 LAB — LIPID PANEL
Chol/HDL Ratio: 2.4 ratio (ref 0.0–4.4)
Cholesterol, Total: 151 mg/dL (ref 100–199)
HDL: 64 mg/dL (ref >39)
LDL Chol Calc (NIH): 70 mg/dL (ref 0–99)
Triglycerides: 91 mg/dL (ref 0–149)
VLDL Cholesterol Cal: 17 mg/dL (ref 5–40)

## 2024-01-06 DIAGNOSIS — Z23 Encounter for immunization: Secondary | ICD-10-CM | POA: Insufficient documentation

## 2024-01-06 DIAGNOSIS — Z6835 Body mass index (BMI) 35.0-35.9, adult: Secondary | ICD-10-CM | POA: Insufficient documentation

## 2024-01-06 NOTE — Assessment & Plan Note (Signed)
 Was increased at last visit. Continue current medications. Will check lipid panel.

## 2024-01-06 NOTE — Assessment & Plan Note (Signed)
 Chronic, she has been taking her medications more regularly. Will check HgbA1c.

## 2024-01-06 NOTE — Assessment & Plan Note (Signed)
 She is encouraged to strive for BMI less than 30 to decrease cardiac risk. Advised to aim for at least 150 minutes of exercise per week as tolerate, also explained can walk for her exercise

## 2024-01-06 NOTE — Assessment & Plan Note (Signed)
 Blood pressure is much better, I did congratulate her on the improvement. Continue current medications. Will check eGFR

## 2024-01-06 NOTE — Assessment & Plan Note (Signed)
 Covid 19 vaccine given in office observed for 15 minutes without any adverse reaction

## 2024-01-08 DIAGNOSIS — H18232 Secondary corneal edema, left eye: Secondary | ICD-10-CM | POA: Diagnosis not present

## 2024-01-08 DIAGNOSIS — Z961 Presence of intraocular lens: Secondary | ICD-10-CM | POA: Diagnosis not present

## 2024-01-08 DIAGNOSIS — Z947 Corneal transplant status: Secondary | ICD-10-CM | POA: Diagnosis not present

## 2024-01-08 DIAGNOSIS — H04123 Dry eye syndrome of bilateral lacrimal glands: Secondary | ICD-10-CM | POA: Diagnosis not present

## 2024-01-08 DIAGNOSIS — H16142 Punctate keratitis, left eye: Secondary | ICD-10-CM | POA: Diagnosis not present

## 2024-01-11 DIAGNOSIS — N1831 Chronic kidney disease, stage 3a: Secondary | ICD-10-CM | POA: Diagnosis not present

## 2024-01-19 DIAGNOSIS — N1831 Chronic kidney disease, stage 3a: Secondary | ICD-10-CM | POA: Diagnosis not present

## 2024-01-19 DIAGNOSIS — E1122 Type 2 diabetes mellitus with diabetic chronic kidney disease: Secondary | ICD-10-CM | POA: Diagnosis not present

## 2024-01-19 DIAGNOSIS — E785 Hyperlipidemia, unspecified: Secondary | ICD-10-CM | POA: Diagnosis not present

## 2024-01-19 DIAGNOSIS — I129 Hypertensive chronic kidney disease with stage 1 through stage 4 chronic kidney disease, or unspecified chronic kidney disease: Secondary | ICD-10-CM | POA: Diagnosis not present

## 2024-01-22 DIAGNOSIS — Z961 Presence of intraocular lens: Secondary | ICD-10-CM | POA: Diagnosis not present

## 2024-01-22 DIAGNOSIS — Z947 Corneal transplant status: Secondary | ICD-10-CM | POA: Diagnosis not present

## 2024-01-22 DIAGNOSIS — H04123 Dry eye syndrome of bilateral lacrimal glands: Secondary | ICD-10-CM | POA: Diagnosis not present

## 2024-01-22 DIAGNOSIS — H18232 Secondary corneal edema, left eye: Secondary | ICD-10-CM | POA: Diagnosis not present

## 2024-01-22 DIAGNOSIS — H16142 Punctate keratitis, left eye: Secondary | ICD-10-CM | POA: Diagnosis not present

## 2024-02-08 DIAGNOSIS — H04123 Dry eye syndrome of bilateral lacrimal glands: Secondary | ICD-10-CM | POA: Diagnosis not present

## 2024-02-08 DIAGNOSIS — Z947 Corneal transplant status: Secondary | ICD-10-CM | POA: Diagnosis not present

## 2024-02-08 DIAGNOSIS — H40003 Preglaucoma, unspecified, bilateral: Secondary | ICD-10-CM | POA: Diagnosis not present

## 2024-02-08 DIAGNOSIS — Z961 Presence of intraocular lens: Secondary | ICD-10-CM | POA: Diagnosis not present

## 2024-02-08 DIAGNOSIS — H18232 Secondary corneal edema, left eye: Secondary | ICD-10-CM | POA: Diagnosis not present

## 2024-02-10 ENCOUNTER — Other Ambulatory Visit: Payer: Self-pay

## 2024-02-10 ENCOUNTER — Ambulatory Visit: Payer: 59

## 2024-02-10 VITALS — BP 122/60 | HR 88 | Temp 98.3°F | Ht <= 58 in | Wt 165.8 lb

## 2024-02-10 DIAGNOSIS — Z0000 Encounter for general adult medical examination without abnormal findings: Secondary | ICD-10-CM

## 2024-02-10 DIAGNOSIS — E7800 Pure hypercholesterolemia, unspecified: Secondary | ICD-10-CM

## 2024-02-10 MED ORDER — ATORVASTATIN CALCIUM 20 MG PO TABS: ORAL_TABLET | ORAL | 1 refills | Status: DC

## 2024-02-10 NOTE — Progress Notes (Signed)
 Subjective:   Kristen Cannon is a 78 y.o. who presents for a Medicare Wellness preventive visit.  Visit Complete: In person    Persons Participating in Visit: Patient.  AWV Questionnaire: No: Patient Medicare AWV questionnaire was not completed prior to this visit.  Cardiac Risk Factors include: advanced age (>81men, >80 women);diabetes mellitus;hypertension     Objective:    Today's Vitals   02/10/24 1031  BP: 122/60  Pulse: 88  Temp: 98.3 F (36.8 C)  TempSrc: Oral  Weight: 165 lb 12.8 oz (75.2 kg)  Height: 4\' 9"  (1.448 m)   Body mass index is 35.88 kg/m.     02/10/2024   10:38 AM 01/21/2023   10:22 AM 12/26/2021   10:37 AM 12/13/2020   10:14 AM 12/07/2019    2:21 PM 07/14/2019    9:38 AM  Advanced Directives  Does Patient Have a Medical Advance Directive? No No No No No No  Would patient like information on creating a medical advance directive? No - Patient declined  No - Patient declined No - Patient declined  Yes (MAU/Ambulatory/Procedural Areas - Information given)    Current Medications (verified) Outpatient Encounter Medications as of 02/10/2024  Medication Sig   aspirin EC 81 MG tablet Take 81 mg by mouth every morning.   atorvastatin  (LIPITOR) 20 MG tablet Take 1 tablet by mouth at bedtime   glipiZIDE  (GLUCOTROL ) 10 MG tablet TAKE 1 TABLET(10 MG) BY MOUTH TWICE DAILY   Olmesartan -amLODIPine -HCTZ 40-5-25 MG TABS Take 1 tablet by mouth daily.   citalopram  (CELEXA ) 10 MG tablet Take 1 tablet by mouth at bedtime (Patient not taking: Reported on 02/10/2024)   No facility-administered encounter medications on file as of 02/10/2024.    Allergies (verified) Patient has no known allergies.   History: Past Medical History:  Diagnosis Date   Diabetes mellitus    High cholesterol    Hypertension    Past Surgical History:  Procedure Laterality Date   CATARACT EXTRACTION Left 12/2018   DR GROAT   CORNEAL TRANSPLANT Left 11/10/2022   CORNEAL  TRANSPLANT Left 11/21/2022   PARTIAL HYSTERECTOMY  1974   Family History  Problem Relation Age of Onset   Breast cancer Sister    Breast cancer Daughter    Early death Mother    Early death Father    Social History   Socioeconomic History   Marital status: Divorced    Spouse name: Not on file   Number of children: Not on file   Years of education: Not on file   Highest education level: Not on file  Occupational History   Occupation: retired  Tobacco Use   Smoking status: Former    Current packs/day: 0.00    Average packs/day: 1 pack/day for 12.0 years (12.0 ttl pk-yrs)    Types: Cigars, Cigarettes    Start date: 24    Quit date: 2007    Years since quitting: 18.3   Smokeless tobacco: Never   Tobacco comments:    1 cigar a week  Vaping Use   Vaping status: Never Used  Substance and Sexual Activity   Alcohol use: Never   Drug use: No   Sexual activity: Not Currently  Other Topics Concern   Not on file  Social History Narrative   Not on file   Social Drivers of Health   Financial Resource Strain: Low Risk  (02/10/2024)   Overall Financial Resource Strain (CARDIA)    Difficulty of Paying Living Expenses: Not hard at  all  Food Insecurity: No Food Insecurity (02/10/2024)   Hunger Vital Sign    Worried About Running Out of Food in the Last Year: Never true    Ran Out of Food in the Last Year: Never true  Transportation Needs: No Transportation Needs (02/10/2024)   PRAPARE - Administrator, Civil Service (Medical): No    Lack of Transportation (Non-Medical): No  Physical Activity: Inactive (02/10/2024)   Exercise Vital Sign    Days of Exercise per Week: 0 days    Minutes of Exercise per Session: 0 min  Stress: No Stress Concern Present (02/10/2024)   [employer] of Occupational Health - Occupational Stress Questionnaire    Feeling of Stress : Not at all  Social Connections: Moderately Isolated (02/10/2024)   Social Connection and Isolation Panel  [NHANES]    Frequency of Communication with Friends and Family: Once a week    Frequency of Social Gatherings with Friends and Family: More than three times a week    Attends Religious Services: More than 4 times per year    Active Member of [employer] or Organizations: No    Attends Engineer, structural: Never    Marital Status: Divorced    Tobacco Counseling Counseling given: Not Answered Tobacco comments: 1 cigar a week    Clinical Intake:  Pre-visit preparation completed: Yes  Pain : No/denies pain     Nutritional Risks: None Diabetes: Yes CBG done?: No Did pt. bring in CBG monitor from home?: No  Lab Results  Component Value Date   HGBA1C 7.3 (H) 12/21/2023   HGBA1C 7.8 (H) 08/06/2023   HGBA1C 7.6 (H) 03/31/2023     How often do you need to have someone help you when you read instructions, pamphlets, or other written materials from your doctor or pharmacy?: 1 - Never  Interpreter Needed?: No  Information entered by :: NAllen LPN   Activities of Daily Living     02/10/2024   10:32 AM  In your present state of health, do you have any difficulty performing the following activities:  Hearing? 0  Vision? 0  Difficulty concentrating or making decisions? 0  Walking or climbing stairs? 0  Dressing or bathing? 0  Doing errands, shopping? 0  Preparing Food and eating ? N  Using the Toilet? N  In the past six months, have you accidently leaked urine? N  Do you have problems with loss of bowel control? N  Managing your Medications? N  Managing your Finances? N  Housekeeping or managing your Housekeeping? N    Patient Care Team: Susanna Epley, FNP as PCP - General (General Practice)  Indicate any recent Medical Services you may have received from other than [facility] providers in the past year (date may be approximate).     Assessment:   This is a routine wellness examination for [facility].  Hearing/Vision screen Hearing Screening - Comments:: Denies  hearing issues Vision Screening - Comments:: Regular eye exams, [facility]   Goals Addressed             This Visit's Progress    Patient Stated       02/10/2024, keep blood sugar and blood pressure down       Depression Screen     02/10/2024   10:39 AM 12/21/2023    2:46 PM 08/06/2023   10:12 AM 03/31/2023    2:56 PM 01/21/2023    2:50 PM 01/21/2023   10:23 AM 11/04/2022  3:04 PM  PHQ 2/9 Scores  PHQ - 2 Score 0 0 4 0 0 0 0  PHQ- 9 Score 1 0 14 0   0    Fall Risk     02/10/2024   10:39 AM 08/06/2023   10:12 AM 03/31/2023    2:56 PM 01/21/2023    2:50 PM 01/21/2023   10:23 AM  Fall Risk   Falls in the past year? 0 0 0 0 0  Number falls in past yr: 0 0 0  0  Injury with Fall? 0 0 0  0  Risk for fall due to : Medication side effect No Fall Risks No Fall Risks  Medication side effect  Follow up Falls prevention discussed;Falls evaluation completed Falls evaluation completed Falls evaluation completed  Falls prevention discussed;Education provided;Falls evaluation completed    MEDICARE RISK AT HOME:  Medicare Risk at Home Any stairs in or around the home?: No (has elevators) If so, are there any without handrails?: No Home free of loose throw rugs in walkways, pet beds, electrical cords, etc?: Yes Adequate lighting in your home to reduce risk of falls?: Yes Life alert?: No Use of a cane, walker or w/c?: No Grab bars in the bathroom?: Yes Shower chair or bench in shower?: No Elevated toilet seat or a handicapped toilet?: Yes  TIMED UP AND GO:  Was the test performed?  Yes  Length of time to ambulate 10 feet: 5 sec Gait steady and fast without use of assistive device  Cognitive Function: 6CIT completed        02/10/2024   10:40 AM 01/21/2023   10:25 AM 12/26/2021   10:43 AM 12/13/2020   10:16 AM 12/07/2019    2:35 PM  6CIT Screen  What Year? 0 points 0 points 0 points 0 points 0 points  What month? 0 points 0 points 0 points 0 points 0 points  What  time? 0 points 0 points 3 points 0 points 0 points  Count back from 20 0 points 0 points 0 points 0 points 0 points  Months in reverse 0 points 0 points 0 points 0 points 0 points  Repeat phrase 4 points 2 points 0 points 0 points 0 points  Total Score 4 points 2 points 3 points 0 points 0 points    Immunizations Immunization History  Administered Date(s) Administered   Fluad Quad(high Dose 65+) 08/11/2019, 07/22/2021, 06/25/2022   Fluad Trivalent(High Dose 65+) 08/06/2023   Influenza, High Dose Seasonal PF 07/14/2019   Influenza-Unspecified 07/07/2018   PFIZER(Purple Top)SARS-COV-2 Vaccination 11/26/2019, 12/21/2019, 08/06/2020   Pfizer(Comirnaty)Fall Seasonal Vaccine 12 years and older 07/31/2022, 08/06/2023, 12/21/2023   Pneumococcal Conjugate-13 09/15/2013   Pneumococcal Polysaccharide-23 09/21/2014, 07/22/2021   Tdap 01/08/2018   Zoster Recombinant(Shingrix ) 06/14/2016, 08/06/2020    Screening Tests Health Maintenance  Topic Date Due   COVID-19 Vaccine (7 - 2024-25 season) 02/15/2024   INFLUENZA VACCINE  05/13/2024   HEMOGLOBIN A1C  06/22/2024   OPHTHALMOLOGY EXAM  07/23/2024   Diabetic kidney evaluation - Urine ACR  08/05/2024   FOOT EXAM  08/05/2024   Diabetic kidney evaluation - eGFR measurement  12/20/2024   Medicare Annual Wellness (AWV)  02/09/2025   DTaP/Tdap/Td (2 - Td or Tdap) 01/09/2028   Pneumonia Vaccine 31+ Years old  Completed   DEXA SCAN  Completed   Hepatitis C Screening  Completed   Zoster Vaccines- Shingrix   Completed   HPV VACCINES  Aged Out   Meningococcal B Vaccine  Aged Out  Health Maintenance  There are no preventive care reminders to display for this patient.  Health Maintenance Items Addressed: Up to date  Additional Screening:  Vision Screening: Recommended annual ophthalmology exams for early detection of glaucoma and other disorders of the eye.  Dental Screening: Recommended annual dental exams for proper oral  hygiene  Community Resource Referral / Chronic Care Management: CRR required this visit?  No   CCM required this visit?  No     Plan:     I have personally reviewed and noted the following in the patient's chart:   Medical and social history Use of alcohol, tobacco or illicit drugs  Current medications and supplements including opioid prescriptions. Patient is not currently taking opioid prescriptions. Functional ability and status Nutritional status Physical activity Advanced directives List of other physicians Hospitalizations, surgeries, and ER visits in previous 12 months Vitals Screenings to include cognitive, depression, and falls Referrals and appointments  In addition, I have reviewed and discussed with patient certain preventive protocols, quality metrics, and best practice recommendations. A written personalized care plan for preventive services as well as general preventive health recommendations were provided to patient.     Areatha Beecham, LPN   1/61/0960   After Visit Summary: (In Person-Printed) AVS printed and given to the patient  Notes: Nothing significant to report at this time.

## 2024-02-10 NOTE — Patient Instructions (Signed)
 [name] , Thank you for taking time to come for your Medicare Wellness Visit. I appreciate your ongoing commitment to your health goals. Please review the following plan we discussed and let me know if I can assist you in the future.   Referrals/Orders/Follow-Ups/Clinician Recommendations: none  This is a list of the screening recommended for you and due dates:  Health Maintenance  Topic Date Due   COVID-19 Vaccine (7 - 2024-25 season) 02/15/2024   Flu Shot  05/13/2024   Hemoglobin A1C  06/22/2024   Eye exam for diabetics  07/23/2024   Yearly kidney health urinalysis for diabetes  08/05/2024   Complete foot exam   08/05/2024   Yearly kidney function blood test for diabetes  12/20/2024   Medicare Annual Wellness Visit  02/09/2025   DTaP/Tdap/Td vaccine (2 - Td or Tdap) 01/09/2028   Pneumonia Vaccine  Completed   DEXA scan (bone density measurement)  Completed   Hepatitis C Screening  Completed   Zoster (Shingles) Vaccine  Completed   HPV Vaccine  Aged Out   Meningitis B Vaccine  Aged Out    Advanced directives: (Declined) Advance directive discussed with you today. Even though you declined this today, please call our office should you change your mind, and we can give you the proper paperwork for you to fill out.  Next Medicare Annual Wellness Visit scheduled for next year: No, office to schedule  insert Preventive Care attachment Insert FALL PREVENTION attachment if needed

## 2024-03-23 DIAGNOSIS — Z947 Corneal transplant status: Secondary | ICD-10-CM | POA: Diagnosis not present

## 2024-03-23 DIAGNOSIS — H18232 Secondary corneal edema, left eye: Secondary | ICD-10-CM | POA: Diagnosis not present

## 2024-03-23 DIAGNOSIS — E119 Type 2 diabetes mellitus without complications: Secondary | ICD-10-CM | POA: Diagnosis not present

## 2024-03-23 DIAGNOSIS — H40003 Preglaucoma, unspecified, bilateral: Secondary | ICD-10-CM | POA: Diagnosis not present

## 2024-03-23 DIAGNOSIS — T868412 Corneal transplant failure, left eye: Secondary | ICD-10-CM | POA: Diagnosis not present

## 2024-03-23 DIAGNOSIS — Z961 Presence of intraocular lens: Secondary | ICD-10-CM | POA: Diagnosis not present

## 2024-03-23 DIAGNOSIS — H04123 Dry eye syndrome of bilateral lacrimal glands: Secondary | ICD-10-CM | POA: Diagnosis not present

## 2024-04-27 ENCOUNTER — Encounter: Payer: Self-pay | Admitting: Nurse Practitioner

## 2024-04-27 ENCOUNTER — Ambulatory Visit: Admitting: Nurse Practitioner

## 2024-04-27 VITALS — BP 110/70 | HR 87 | Temp 98.8°F | Ht <= 58 in | Wt 169.2 lb

## 2024-04-27 DIAGNOSIS — E1122 Type 2 diabetes mellitus with diabetic chronic kidney disease: Secondary | ICD-10-CM

## 2024-04-27 DIAGNOSIS — I700 Atherosclerosis of aorta: Secondary | ICD-10-CM | POA: Diagnosis not present

## 2024-04-27 DIAGNOSIS — E661 Drug-induced obesity: Secondary | ICD-10-CM

## 2024-04-27 DIAGNOSIS — K5900 Constipation, unspecified: Secondary | ICD-10-CM

## 2024-04-27 DIAGNOSIS — Z6836 Body mass index (BMI) 36.0-36.9, adult: Secondary | ICD-10-CM

## 2024-04-27 DIAGNOSIS — E66812 Obesity, class 2: Secondary | ICD-10-CM

## 2024-04-27 DIAGNOSIS — N1831 Chronic kidney disease, stage 3a: Secondary | ICD-10-CM | POA: Diagnosis not present

## 2024-04-27 DIAGNOSIS — I129 Hypertensive chronic kidney disease with stage 1 through stage 4 chronic kidney disease, or unspecified chronic kidney disease: Secondary | ICD-10-CM | POA: Diagnosis not present

## 2024-04-27 DIAGNOSIS — E7800 Pure hypercholesterolemia, unspecified: Secondary | ICD-10-CM | POA: Diagnosis not present

## 2024-04-27 MED ORDER — GLIPIZIDE 10 MG PO TABS: ORAL_TABLET | ORAL | 1 refills | Status: DC

## 2024-04-27 MED ORDER — OLMESARTAN-AMLODIPINE-HCTZ 40-5-25 MG PO TABS: 1.0000 | ORAL_TABLET | Freq: Every day | ORAL | 1 refills | Status: AC

## 2024-04-27 MED ORDER — LUBIPROSTONE 8 MCG PO CAPS: 8.0000 ug | ORAL_CAPSULE | Freq: Every day | ORAL | 1 refills | Status: AC

## 2024-04-27 NOTE — Assessment & Plan Note (Signed)
 Blood pressure is much better, I did congratulate her on the improvement. Continue current medications. Will check eGFR

## 2024-04-27 NOTE — Assessment & Plan Note (Signed)
 Intermittent constipation with infrequent bowel movements. Linzess  not covered by insurance; Amitiza  is a suitable alternative. - Prescribe Amitiza  at lowest dose to assess effectiveness

## 2024-04-27 NOTE — Assessment & Plan Note (Signed)
 Cholesterol levels are normal at last visit. Continue current medications. Will check lipid panel.   Lab Results  Component Value Date   CHOL 151 12/21/2023   HDL 64 12/21/2023   LDLCALC 70 12/21/2023   TRIG 91 12/21/2023   CHOLHDL 2.4 12/21/2023

## 2024-04-27 NOTE — Assessment & Plan Note (Signed)
 She is encouraged to strive for BMI less than 30 to decrease cardiac risk. Advised to aim for at least 150 minutes of exercise per week.

## 2024-04-27 NOTE — Assessment & Plan Note (Signed)
 Continue statin, tolerating well

## 2024-04-27 NOTE — Assessment & Plan Note (Signed)
 Ongoing management with Glipizide  10 mg orally twice daily. HgbA1c is better at last visit, will  Lab Results  Component Value Date   HGBA1C 7.3 (H) 12/21/2023   HGBA1C 7.8 (H) 08/06/2023   HGBA1C 7.6 (H) 03/31/2023  - Refill Glipizide  prescription.

## 2024-04-27 NOTE — Progress Notes (Signed)
 LILLETTE Kristeen JINNY Gladis, CMA,acting as a Neurosurgeon for Kristen Ada, FNP.,have documented all relevant documentation on the behalf of Kristen Ada, FNP,as directed by  Kristen Ada, FNP while in the presence of Kristen Ada, FNP.  Subjective:  Patient ID: Kristen Cannon , female    DOB: 1946/04/27 , 78 y.o.   MRN: [id number]  Chief Complaint  Patient presents with   Diabetes    Patient presents today for a chol and dm follow up, Patient reports compliance with medication. Patient denies any chest pain, SOB, or headaches. Patient has no concerns today.    Discussed the use of AI scribe software for clinical note transcription with the patient, who gave verbal consent to proceed.  History of Present Illness Kristen Cannon is an 78 year old female with hypertension and diabetes who presents for follow-up.  She is currently taking olmesartan , amlodipine , and hydrochlorothiazide as prescribed for hypertension. For diabetes, she takes glipizide  twice daily.  She experiences constipation with bowel movements occurring only once or twice a week. Linzess  was previously used but discontinued in 2022. She is not using any stool softeners currently. She consumes about three 16-ounce bottles of water daily.  She discontinued Celexa  due to side effects, describing it as making her feel like a 'zombie'. Will discontinue from her medication list.   She locked her keys in her car today and is waiting for a locksmith, which has caused some stress.   Past Medical History:  Diagnosis Date   Diabetes mellitus    High cholesterol    Hypertension      Family History  Problem Relation Age of Onset   Breast cancer Sister    Breast cancer Daughter    Early death Mother    Early death Father      Current Outpatient Medications:    aspirin EC 81 MG tablet, Take 81 mg by mouth every morning., Disp: , Rfl:    atorvastatin  (LIPITOR) 20 MG tablet, Take 1 tablet by mouth at bedtime, Disp: 90  tablet, Rfl: 1   lubiprostone  (AMITIZA ) 8 MCG capsule, Take 1 capsule (8 mcg total) by mouth daily with breakfast., Disp: 90 capsule, Rfl: 1   glipiZIDE  (GLUCOTROL ) 10 MG tablet, TAKE 1 TABLET(10 MG) BY MOUTH TWICE DAILY, Disp: 180 tablet, Rfl: 1   Olmesartan -amLODIPine -HCTZ 40-5-25 MG TABS, Take 1 tablet by mouth daily., Disp: 90 tablet, Rfl: 1   No Known Allergies   Review of Systems  Constitutional: Negative.  Negative for fatigue.  Respiratory: Negative.  Negative for shortness of breath.   Cardiovascular: Negative.  Negative for chest pain and palpitations.  Gastrointestinal: Negative.   Endocrine: Negative for polydipsia, polyphagia and polyuria.  Neurological: Negative.  Negative for dizziness and headaches.  Psychiatric/Behavioral: Negative.       Today's Vitals   04/27/24 1408  BP: 110/70  Pulse: 87  Temp: 98.8 F (37.1 C)  TempSrc: Oral  Weight: 169 lb 3.2 oz (76.7 kg)  Height: 4' 9 (1.448 m)  PainSc: 0-No pain   Body mass index is 36.61 kg/m.  Wt Readings from Last 3 Encounters:  04/27/24 169 lb 3.2 oz (76.7 kg)  02/10/24 165 lb 12.8 oz (75.2 kg)  12/21/23 161 lb 12.8 oz (73.4 kg)      Objective:  Physical Exam Vitals and nursing note reviewed.  Constitutional:      General: She is not in acute distress.    Appearance: Normal appearance. She is obese.  Cardiovascular:     Rate  and Rhythm: Normal rate and regular rhythm.     Pulses: Normal pulses.     Heart sounds: Normal heart sounds. No murmur heard. Pulmonary:     Effort: Pulmonary effort is normal. No respiratory distress.     Breath sounds: Normal breath sounds. No wheezing.  Abdominal:     General: Abdomen is flat. Bowel sounds are normal.     Palpations: Abdomen is soft.  Musculoskeletal:     Cervical back: Normal range of motion and neck supple.  Skin:    General: Skin is warm and dry.     Capillary Refill: Capillary refill takes less than 2 seconds.  Neurological:     General: No focal  deficit present.     Mental Status: She is alert and oriented to person, place, and time.     Cranial Nerves: No cranial nerve deficit.     Motor: No weakness.  Psychiatric:        Mood and Affect: Mood normal.        Behavior: Behavior normal.        Thought Content: Thought content normal.        Judgment: Judgment normal.         02/10/2024   10:39 AM 12/21/2023    2:46 PM 08/06/2023   10:12 AM 03/31/2023    2:56 PM 01/21/2023    2:50 PM  Depression screen PHQ 2/9  Decreased Interest 0 0 3 0 0  Down, Depressed, Hopeless 0 0 1 0 0  PHQ - 2 Score 0 0 4 0 0  Altered sleeping 0 0 1 0   Tired, decreased energy 1 0 2 0   Change in appetite 0 0 3 0   Feeling bad or failure about yourself  0 0 1 0   Trouble concentrating 0 0 3 0   Moving slowly or fidgety/restless 0 0 0 0   Suicidal thoughts 0 0 0 0   PHQ-9 Score 1 0 14 0   Difficult doing work/chores Not difficult at all Not difficult at all Very difficult Not difficult at all     Assessment And Plan:  Type 2 diabetes mellitus with stage 3a chronic kidney disease, without long-term current use of insulin (HCC) Assessment & Plan: Ongoing management with Glipizide  10 mg orally twice daily. HgbA1c is better at last visit, will  Lab Results  Component Value Date   HGBA1C 7.3 (H) 12/21/2023   HGBA1C 7.8 (H) 08/06/2023   HGBA1C 7.6 (H) 03/31/2023  - Refill Glipizide  prescription.  Orders: -     Hemoglobin A1c -     glipiZIDE ; TAKE 1 TABLET(10 MG) BY MOUTH TWICE DAILY  Dispense: 180 tablet; Refill: 1 -     BMP8+eGFR  Pure hypercholesterolemia Assessment & Plan: Cholesterol levels are normal at last visit. Continue current medications. Will check lipid panel.   Lab Results  Component Value Date   CHOL 151 12/21/2023   HDL 64 12/21/2023   LDLCALC 70 12/21/2023   TRIG 91 12/21/2023   CHOLHDL 2.4 12/21/2023     Orders: -     Lipid panel  Hypertensive nephropathy Assessment & Plan: Blood pressure is much better, I did  congratulate her on the improvement. Continue current medications. Will check eGFR  Orders: -     Olmesartan -amLODIPine -HCTZ; Take 1 tablet by mouth daily.  Dispense: 90 tablet; Refill: 1 -     BMP8+eGFR  Atherosclerosis of aorta (HCC) Assessment & Plan: Continue statin, tolerating well.  Orders: -  BMP8+eGFR  Class 2 drug-induced obesity with serious comorbidity and body mass index (BMI) of 36.0 to 36.9 in adult Assessment & Plan: She is encouraged to strive for BMI less than 30 to decrease cardiac risk. Advised to aim for at least 150 minutes of exercise per week.    Constipation, unspecified constipation type Assessment & Plan: Intermittent constipation with infrequent bowel movements. Linzess  not covered by insurance; Amitiza  is a suitable alternative. - Prescribe Amitiza  at lowest dose to assess effectiveness   Other orders -     Lubiprostone ; Take 1 capsule (8 mcg total) by mouth daily with breakfast.  Dispense: 90 capsule; Refill: 1   Assessment & Plan    Return for keep same next.  Patient was given opportunity to ask questions. Patient verbalized understanding of the plan and was able to repeat key elements of the plan. All questions were answered to their satisfaction.    LILLETTE Kristen Ada, FNP, have reviewed all documentation for this visit. The documentation on 04/27/24 for the exam, diagnosis, procedures, and orders are all accurate and complete.   IF YOU HAVE BEEN REFERRED TO A SPECIALIST, IT MAY TAKE 1-2 WEEKS TO SCHEDULE/PROCESS THE REFERRAL. IF YOU HAVE NOT HEARD FROM US /SPECIALIST IN TWO WEEKS, PLEASE GIVE US  A CALL AT [phone].

## 2024-04-28 LAB — BMP8+EGFR
BUN/Creatinine Ratio: 20 (ref 12–28)
BUN: 27 mg/dL (ref 8–27)
CO2: 23 mmol/L (ref 20–29)
Calcium: 9.6 mg/dL (ref 8.7–10.3)
Chloride: 106 mmol/L (ref 96–106)
Creatinine, Ser: 1.33 mg/dL — ABNORMAL HIGH (ref 0.57–1.00)
Glucose: 140 mg/dL — ABNORMAL HIGH (ref 70–99)
Potassium: 3.6 mmol/L (ref 3.5–5.2)
Sodium: 144 mmol/L (ref 134–144)
eGFR: 40 mL/min/1.73 — ABNORMAL LOW (ref >59)

## 2024-04-28 LAB — HEMOGLOBIN A1C
Est. average glucose Bld gHb Est-mCnc: 169 mg/dL
Hgb A1c MFr Bld: 7.5 % — ABNORMAL HIGH (ref 4.8–5.6)

## 2024-04-28 LAB — LIPID PANEL
Chol/HDL Ratio: 2.6 ratio (ref 0.0–4.4)
Cholesterol, Total: 158 mg/dL (ref 100–199)
HDL: 61 mg/dL (ref >39)
LDL Chol Calc (NIH): 82 mg/dL (ref 0–99)
Triglycerides: 78 mg/dL (ref 0–149)
VLDL Cholesterol Cal: 15 mg/dL (ref 5–40)

## 2024-05-05 ENCOUNTER — Other Ambulatory Visit: Payer: Self-pay | Admitting: Nurse Practitioner

## 2024-05-05 DIAGNOSIS — E1122 Type 2 diabetes mellitus with diabetic chronic kidney disease: Secondary | ICD-10-CM

## 2024-06-29 DIAGNOSIS — T868419 Corneal transplant failure, unspecified eye: Secondary | ICD-10-CM | POA: Diagnosis not present

## 2024-06-29 DIAGNOSIS — H04123 Dry eye syndrome of bilateral lacrimal glands: Secondary | ICD-10-CM | POA: Diagnosis not present

## 2024-06-29 DIAGNOSIS — Z961 Presence of intraocular lens: Secondary | ICD-10-CM | POA: Diagnosis not present

## 2024-06-29 DIAGNOSIS — H18232 Secondary corneal edema, left eye: Secondary | ICD-10-CM | POA: Diagnosis not present

## 2024-06-29 DIAGNOSIS — Z947 Corneal transplant status: Secondary | ICD-10-CM | POA: Diagnosis not present

## 2024-06-29 DIAGNOSIS — H40003 Preglaucoma, unspecified, bilateral: Secondary | ICD-10-CM | POA: Diagnosis not present

## 2024-07-01 DIAGNOSIS — Z947 Corneal transplant status: Secondary | ICD-10-CM | POA: Diagnosis not present

## 2024-07-01 DIAGNOSIS — T868419 Corneal transplant failure, unspecified eye: Secondary | ICD-10-CM | POA: Diagnosis not present

## 2024-07-01 DIAGNOSIS — H18232 Secondary corneal edema, left eye: Secondary | ICD-10-CM | POA: Diagnosis not present

## 2024-07-07 DIAGNOSIS — T868412 Corneal transplant failure, left eye: Secondary | ICD-10-CM | POA: Diagnosis not present

## 2024-07-07 DIAGNOSIS — I10 Essential (primary) hypertension: Secondary | ICD-10-CM | POA: Diagnosis not present

## 2024-07-07 DIAGNOSIS — E119 Type 2 diabetes mellitus without complications: Secondary | ICD-10-CM | POA: Diagnosis not present

## 2024-07-07 DIAGNOSIS — Z961 Presence of intraocular lens: Secondary | ICD-10-CM | POA: Diagnosis not present

## 2024-07-07 DIAGNOSIS — H18232 Secondary corneal edema, left eye: Secondary | ICD-10-CM | POA: Diagnosis not present

## 2024-07-08 DIAGNOSIS — H40003 Preglaucoma, unspecified, bilateral: Secondary | ICD-10-CM | POA: Diagnosis not present

## 2024-07-08 DIAGNOSIS — H04123 Dry eye syndrome of bilateral lacrimal glands: Secondary | ICD-10-CM | POA: Diagnosis not present

## 2024-07-08 DIAGNOSIS — Z961 Presence of intraocular lens: Secondary | ICD-10-CM | POA: Diagnosis not present

## 2024-07-08 DIAGNOSIS — Z947 Corneal transplant status: Secondary | ICD-10-CM | POA: Diagnosis not present

## 2024-07-11 DIAGNOSIS — Z947 Corneal transplant status: Secondary | ICD-10-CM | POA: Diagnosis not present

## 2024-07-11 DIAGNOSIS — Z961 Presence of intraocular lens: Secondary | ICD-10-CM | POA: Diagnosis not present

## 2024-07-11 DIAGNOSIS — H04123 Dry eye syndrome of bilateral lacrimal glands: Secondary | ICD-10-CM | POA: Diagnosis not present

## 2024-07-11 DIAGNOSIS — H40003 Preglaucoma, unspecified, bilateral: Secondary | ICD-10-CM | POA: Diagnosis not present

## 2024-07-15 DIAGNOSIS — H04123 Dry eye syndrome of bilateral lacrimal glands: Secondary | ICD-10-CM | POA: Diagnosis not present

## 2024-07-15 DIAGNOSIS — Z947 Corneal transplant status: Secondary | ICD-10-CM | POA: Diagnosis not present

## 2024-07-15 DIAGNOSIS — H40003 Preglaucoma, unspecified, bilateral: Secondary | ICD-10-CM | POA: Diagnosis not present

## 2024-07-15 DIAGNOSIS — Z961 Presence of intraocular lens: Secondary | ICD-10-CM | POA: Diagnosis not present

## 2024-08-05 DIAGNOSIS — H04123 Dry eye syndrome of bilateral lacrimal glands: Secondary | ICD-10-CM | POA: Diagnosis not present

## 2024-08-05 DIAGNOSIS — H40003 Preglaucoma, unspecified, bilateral: Secondary | ICD-10-CM | POA: Diagnosis not present

## 2024-08-05 DIAGNOSIS — Z961 Presence of intraocular lens: Secondary | ICD-10-CM | POA: Diagnosis not present

## 2024-08-05 DIAGNOSIS — Z947 Corneal transplant status: Secondary | ICD-10-CM | POA: Diagnosis not present

## 2024-08-10 ENCOUNTER — Encounter: Payer: Self-pay | Admitting: Nurse Practitioner

## 2024-08-10 ENCOUNTER — Ambulatory Visit: Payer: Self-pay | Admitting: Nurse Practitioner

## 2024-08-10 VITALS — BP 110/70 | HR 79 | Temp 98.9°F | Ht <= 58 in | Wt 172.4 lb

## 2024-08-10 DIAGNOSIS — E7800 Pure hypercholesterolemia, unspecified: Secondary | ICD-10-CM | POA: Diagnosis not present

## 2024-08-10 DIAGNOSIS — Z23 Encounter for immunization: Secondary | ICD-10-CM

## 2024-08-10 DIAGNOSIS — Z0000 Encounter for general adult medical examination without abnormal findings: Secondary | ICD-10-CM | POA: Diagnosis not present

## 2024-08-10 DIAGNOSIS — Z79899 Other long term (current) drug therapy: Secondary | ICD-10-CM

## 2024-08-10 DIAGNOSIS — E559 Vitamin D deficiency, unspecified: Secondary | ICD-10-CM

## 2024-08-10 DIAGNOSIS — E1122 Type 2 diabetes mellitus with diabetic chronic kidney disease: Secondary | ICD-10-CM

## 2024-08-10 DIAGNOSIS — I700 Atherosclerosis of aorta: Secondary | ICD-10-CM

## 2024-08-10 DIAGNOSIS — I129 Hypertensive chronic kidney disease with stage 1 through stage 4 chronic kidney disease, or unspecified chronic kidney disease: Secondary | ICD-10-CM

## 2024-08-10 DIAGNOSIS — K5900 Constipation, unspecified: Secondary | ICD-10-CM

## 2024-08-10 DIAGNOSIS — N1831 Chronic kidney disease, stage 3a: Secondary | ICD-10-CM

## 2024-08-10 MED ORDER — ATORVASTATIN CALCIUM 20 MG PO TABS: ORAL_TABLET | ORAL | 1 refills | Status: AC

## 2024-08-10 NOTE — Progress Notes (Addendum)
 LILLETTE Kristeen JINNY Gladis, CMA,acting as a neurosurgeon for Kristen Ada, FNP.,have documented all relevant documentation on the behalf of Kristen Ada, FNP,as directed by  Kristen Ada, FNP while in the presence of Kristen Ada, FNP.  Subjective:    Patient ID: Kristen Cannon , female    DOB: 1946/03/04 , 78 y.o.   MRN: [id number]  Chief Complaint  Patient presents with   Annual Exam    Patient presents today for HM, Patient reports compliance with medication. Patient denies any chest pain, SOB, or headaches. Patient has no concerns today.      HPI  Discussed the use of AI scribe software for clinical note transcription with the patient, who gave verbal consent to proceed.  History of Present Illness Kristen Cannon is an 78 year old female who presents for an annual physical exam.  She underwent surgery on September 24th and is currently using steroid eye drops with a pink top, having been taken off the tan top drops.  She takes atorvastatin  but is unsure about other medications. She discontinued citalopram  in July and is confused about why the pharmacy continues to call her about it.  She experiences a rash, which she attributes to hydrochlorothiazide, a component of her blood pressure medication. The rash is described as a discoloration of the skin, which has improved with the use of AD ointment.  She engages in physical activity, walking about three times a week, but notes a decreased appetite compared to before, with less consumption of junk food.  No shortness of breath, chest pain, urinary issues, or constipation. She reports some numbness in her feet. She does not perform self-breast exams at home and has no trouble swallowing.   Past Medical History:  Diagnosis Date   Diabetes mellitus    High cholesterol    Hypertension      Family History  Problem Relation Age of Onset   Breast cancer Sister    Breast cancer Daughter    Early death Mother    Early death Father       Current Outpatient Medications:    aspirin EC 81 MG tablet, Take 81 mg by mouth every morning., Disp: , Rfl:    carboxymethylcellulose (REFRESH PLUS) 0.5 % SOLN, Apply 1 drop to eye daily as needed., Disp: , Rfl:    glipiZIDE  (GLUCOTROL ) 10 MG tablet, TAKE 1 TABLET(10 MG) BY MOUTH TWICE DAILY, Disp: 180 tablet, Rfl: 1   lubiprostone  (AMITIZA ) 8 MCG capsule, Take 1 capsule (8 mcg total) by mouth daily with breakfast., Disp: 90 capsule, Rfl: 1   Olmesartan -amLODIPine -HCTZ 40-5-25 MG TABS, Take 1 tablet by mouth daily., Disp: 90 tablet, Rfl: 1   prednisoLONE acetate (PRED FORTE) 1 % ophthalmic suspension, Apply 1 drop to eye in the morning, at noon, and at bedtime., Disp: , Rfl:    atorvastatin  (LIPITOR) 20 MG tablet, Take 1 tablet by mouth at bedtime, Disp: 90 tablet, Rfl: 1   Vitamin D , Ergocalciferol , (DRISDOL ) 1.25 MG (50000 UNIT) CAPS capsule, Take 1 capsule (50,000 Units total) by mouth every 7 (seven) days., Disp: 12 capsule, Rfl: 1   No Known Allergies    The patient states she uses status post hysterectomy for birth control. No LMP recorded. Patient has had a hysterectomy.. Negative for Dysmenorrhea and Negative for Menorrhagia. Negative for: breast discharge, breast lump(s), breast pain and breast self exam. Associated symptoms include abnormal vaginal bleeding. Pertinent negatives include abnormal bleeding (hematology), anxiety, decreased libido, depression, difficulty falling sleep, dyspareunia, history of  infertility, nocturia, sexual dysfunction, sleep disturbances, urinary incontinence, urinary urgency, vaginal discharge and vaginal itching. Diet regular; feels like her appetite has decreased. The patient states her exercise level is minimal 3 days a week.   The patient's tobacco use is:  Social History   Tobacco Use  Smoking Status Former   Current packs/day: 0.00   Average packs/day: 1 pack/day for 12.0 years (12.0 ttl pk-yrs)   Types: Cigars, Cigarettes   Start date:  5   Quit date: 2007   Years since quitting: 18.8  Smokeless Tobacco Never  Tobacco Comments   1 cigar a week   She has been exposed to passive smoke. The patient's alcohol use is:  Social History   Substance and Sexual Activity  Alcohol Use Never    Review of Systems  Constitutional: Negative.   HENT: Negative.    Eyes: Negative.   Respiratory: Negative.    Cardiovascular: Negative.   Gastrointestinal: Negative.   Endocrine: Negative.   Genitourinary: Negative.   Musculoskeletal: Negative.   Skin: Negative.   Allergic/Immunologic: Negative.   Neurological:  Negative for dizziness.       Has been having episodes of her balance being off regularly  Hematological: Negative.   Psychiatric/Behavioral: Negative.         Depressed mood     Today's Vitals   08/10/24 1120  BP: 110/70  Pulse: 79  Temp: 98.9 F (37.2 C)  TempSrc: Oral  Weight: 172 lb 6.4 oz (78.2 kg)  Height: 4' 9 (1.448 m)  PainSc: 0-No pain   Body mass index is 37.31 kg/m.  Wt Readings from Last 3 Encounters:  08/10/24 172 lb 6.4 oz (78.2 kg)  04/27/24 169 lb 3.2 oz (76.7 kg)  02/10/24 165 lb 12.8 oz (75.2 kg)     Objective:  Physical Exam Vitals and nursing note reviewed.  Constitutional:      General: She is not in acute distress.    Appearance: Normal appearance. She is well-developed.  HENT:     Head: Normocephalic and atraumatic.     Right Ear: Hearing, tympanic membrane, ear canal and external ear normal. There is no impacted cerumen.     Left Ear: Hearing, tympanic membrane, ear canal and external ear normal. There is no impacted cerumen.     Nose: Nose normal.     Mouth/Throat:     Mouth: Mucous membranes are moist.  Eyes:     General: Lids are normal.     Extraocular Movements: Extraocular movements intact.     Conjunctiva/sclera: Conjunctivae normal.     Pupils: Pupils are equal, round, and reactive to light.     Funduscopic exam:    Right eye: No papilledema.        Left  eye: No papilledema.  Neck:     Thyroid : No thyroid  mass.     Vascular: No carotid bruit.  Cardiovascular:     Rate and Rhythm: Normal rate and regular rhythm.     Pulses: Normal pulses.     Heart sounds: Normal heart sounds. No murmur heard. Pulmonary:     Effort: Pulmonary effort is normal.     Breath sounds: Normal breath sounds.  Chest:     Chest wall: No mass.  Breasts:    Tanner Score is 5.     Breasts are symmetrical.     Right: Normal. No tenderness.     Left: No tenderness.  Abdominal:     General: Abdomen is flat. Bowel sounds are  normal. There is no distension.     Palpations: Abdomen is soft.     Tenderness: There is no abdominal tenderness.  Genitourinary:    Rectum: Guaiac result negative.  Musculoskeletal:        General: No swelling or tenderness. Normal range of motion.     Cervical back: Full passive range of motion without pain, normal range of motion and neck supple.     Right lower leg: No edema.     Left lower leg: No edema.  Lymphadenopathy:     Upper Body:     Right upper body: No supraclavicular or axillary adenopathy.     Left upper body: No supraclavicular or axillary adenopathy.  Skin:    General: Skin is warm and dry.     Capillary Refill: Capillary refill takes less than 2 seconds.  Neurological:     General: No focal deficit present.     Mental Status: She is alert and oriented to person, place, and time.     Cranial Nerves: No cranial nerve deficit.     Sensory: No sensory deficit.     Motor: No weakness.  Psychiatric:        Mood and Affect: Mood normal.        Behavior: Behavior normal.        Thought Content: Thought content normal.        Judgment: Judgment normal.      Diabetic foot exam was performed with the following findings:   No deformities, ulcerations, or other skin breakdown Normal sensation of 10g monofilament Intact posterior tibialis and dorsalis pedis pulses Decreased sensation bilateral feet at the area of  callous     Assessment And Plan:     Encounter for annual health examination Assessment & Plan: Routine wellness visit focused on exercise, diet, and preventive care. - Administer flu shot. - Provide list of local dentists. - Re-measure height. - Encourage 150 minutes of exercise per week. - Encourage monthly self-breast exams. - Encourage daily foot checks for open areas.   Type 2 diabetes mellitus with stage 3a chronic kidney disease, without long-term current use of insulin (HCC) Assessment & Plan: Type 2 diabetes with chronic kidney disease. Emphasized foot care to prevent complications. Diabetic foot exam done  Orders: -     EKG 12-Lead -     CMP14+EGFR -     Hemoglobin A1c -     Microalbumin / creatinine urine ratio; Future  Pure hypercholesterolemia Assessment & Plan: Cholesterol levels are normal at last visit. Continue current medications. Will check lipid panel.     Orders: -     CMP14+EGFR -     Lipid panel -     Atorvastatin  Calcium ; Take 1 tablet by mouth at bedtime  Dispense: 90 tablet; Refill: 1  Hypertensive nephropathy Assessment & Plan: Blood pressure is much better, I did congratulate her on the improvement. Continue current medications. Will check eGFR. EKG done with Sinus Rhythm  -Left axis -anterior fascicular block.    Vitamin D  deficiency Assessment & Plan: Will check vitamin D  level and supplement as needed.    Also encouraged to spend 15 minutes in the sun daily.    Orders: -     VITAMIN D  25 Hydroxy (Vit-D Deficiency, Fractures)  Need for influenza vaccination Assessment & Plan: Influenza vaccine administered Encouraged to take Tylenol  as needed for fever or muscle aches.   Orders: -     Flu vaccine HIGH DOSE PF(Fluzone Trivalent)  Other  long term (current) drug therapy -     CBC with Differential/Platelet -     TSH  Atherosclerosis of aorta Assessment & Plan: Continue statin, tolerating well.   Obesity, morbid  (HCC) Assessment & Plan: She is encouraged to strive for BMI less than 30 to decrease cardiac risk. Advised to aim for at least 150 minutes of exercise per week as tolerate, also explained can walk for her exercise   Constipation, unspecified constipation type Assessment & Plan: Intermittent constipation with infrequent bowel movements. Continue Amitiza      Return for 1 year physical, controlled DM check 4 months. Patient was given opportunity to ask questions. Patient verbalized understanding of the plan and was able to repeat key elements of the plan. All questions were answered to their satisfaction.   Kristen Ada, FNP  I, Kristen Ada, FNP, have reviewed all documentation for this visit. The documentation on 08/10/24 for the exam, diagnosis, procedures, and orders are all accurate and complete.

## 2024-08-11 LAB — CBC WITH DIFFERENTIAL/PLATELET
Basophils Absolute: 0 x10E3/uL (ref 0.0–0.2)
Basos: 1 %
EOS (ABSOLUTE): 0.2 x10E3/uL (ref 0.0–0.4)
Eos: 3 %
Hematocrit: 36 % (ref 34.0–46.6)
Hemoglobin: 11.4 g/dL (ref 11.1–15.9)
Immature Grans (Abs): 0 x10E3/uL (ref 0.0–0.1)
Immature Granulocytes: 0 %
Lymphocytes Absolute: 3 x10E3/uL (ref 0.7–3.1)
Lymphs: 50 %
MCH: 28.6 pg (ref 26.6–33.0)
MCHC: 31.7 g/dL (ref 31.5–35.7)
MCV: 90 fL (ref 79–97)
Monocytes Absolute: 0.5 x10E3/uL (ref 0.1–0.9)
Monocytes: 9 %
Neutrophils Absolute: 2.2 x10E3/uL (ref 1.4–7.0)
Neutrophils: 37 %
Platelets: 255 x10E3/uL (ref 150–450)
RBC: 3.99 x10E6/uL (ref 3.77–5.28)
RDW: 13.1 % (ref 11.7–15.4)
WBC: 6 x10E3/uL (ref 3.4–10.8)

## 2024-08-11 LAB — CMP14+EGFR
ALT: 17 IU/L (ref 0–32)
AST: 26 IU/L (ref 0–40)
Albumin: 4.3 g/dL (ref 3.8–4.8)
Alkaline Phosphatase: 83 IU/L (ref 49–135)
BUN/Creatinine Ratio: 23 (ref 12–28)
BUN: 37 mg/dL — ABNORMAL HIGH (ref 8–27)
Bilirubin Total: 0.3 mg/dL (ref 0.0–1.2)
CO2: 25 mmol/L (ref 20–29)
Calcium: 10.3 mg/dL (ref 8.7–10.3)
Chloride: 101 mmol/L (ref 96–106)
Creatinine, Ser: 1.6 mg/dL — ABNORMAL HIGH (ref 0.57–1.00)
Globulin, Total: 3.1 g/dL (ref 1.5–4.5)
Glucose: 82 mg/dL (ref 70–99)
Potassium: 3.9 mmol/L (ref 3.5–5.2)
Sodium: 140 mmol/L (ref 134–144)
Total Protein: 7.4 g/dL (ref 6.0–8.5)
eGFR: 32 mL/min/1.73 — ABNORMAL LOW (ref >59)

## 2024-08-11 LAB — TSH: TSH: 5.04 u[IU]/mL — ABNORMAL HIGH (ref 0.450–4.500)

## 2024-08-11 LAB — LIPID PANEL
Chol/HDL Ratio: 2.3 ratio (ref 0.0–4.4)
Cholesterol, Total: 155 mg/dL (ref 100–199)
HDL: 68 mg/dL (ref >39)
LDL Chol Calc (NIH): 68 mg/dL (ref 0–99)
Triglycerides: 104 mg/dL (ref 0–149)
VLDL Cholesterol Cal: 19 mg/dL (ref 5–40)

## 2024-08-11 LAB — HEMOGLOBIN A1C
Est. average glucose Bld gHb Est-mCnc: 169 mg/dL
Hgb A1c MFr Bld: 7.5 % — ABNORMAL HIGH (ref 4.8–5.6)

## 2024-08-11 LAB — VITAMIN D 25 HYDROXY (VIT D DEFICIENCY, FRACTURES): Vit D, 25-Hydroxy: 23.2 ng/mL — ABNORMAL LOW (ref 30.0–100.0)

## 2024-08-17 ENCOUNTER — Ambulatory Visit: Payer: Self-pay | Admitting: Nurse Practitioner

## 2024-08-17 MED ORDER — VITAMIN D (ERGOCALCIFEROL) 1.25 MG (50000 UNIT) PO CAPS: 50000.0000 [IU] | ORAL_CAPSULE | ORAL | 1 refills | Status: AC

## 2024-08-17 NOTE — Assessment & Plan Note (Signed)
 Influenza vaccine administered Encouraged to take Tylenol as needed for fever or muscle aches.

## 2024-08-17 NOTE — Assessment & Plan Note (Addendum)
 Blood pressure is much better, I did congratulate her on the improvement. Continue current medications. Will check eGFR. EKG done with Sinus Rhythm  -Left axis -anterior fascicular block.

## 2024-08-17 NOTE — Assessment & Plan Note (Signed)
 Cholesterol levels are normal at last visit. Continue current medications. Will check lipid panel.

## 2024-08-17 NOTE — Assessment & Plan Note (Signed)
 Continue statin, tolerating well

## 2024-08-17 NOTE — Assessment & Plan Note (Signed)
 Will check vitamin D  level and supplement as needed.    Also encouraged to spend 15 minutes in the sun daily.

## 2024-08-17 NOTE — Assessment & Plan Note (Signed)
 Routine wellness visit focused on exercise, diet, and preventive care. - Administer flu shot. - Provide list of local dentists. - Re-measure height. - Encourage 150 minutes of exercise per week. - Encourage monthly self-breast exams. - Encourage daily foot checks for open areas.

## 2024-08-17 NOTE — Assessment & Plan Note (Signed)
 Intermittent constipation with infrequent bowel movements. Continue Amitiza 

## 2024-08-17 NOTE — Assessment & Plan Note (Addendum)
 Type 2 diabetes with chronic kidney disease. Emphasized foot care to prevent complications. Diabetic foot exam done

## 2024-08-25 NOTE — Assessment & Plan Note (Signed)
 She is encouraged to strive for BMI less than 30 to decrease cardiac risk. Advised to aim for at least 150 minutes of exercise per week as tolerate, also explained can walk for her exercise

## 2024-09-21 NOTE — Progress Notes (Signed)
 She needs to limit her intake of gluten which is like flour and sweets, she can come in for a repeat after 2 weeks. She needs to increase to eating veggies daily.

## 2024-09-26 ENCOUNTER — Other Ambulatory Visit

## 2024-09-28 ENCOUNTER — Other Ambulatory Visit

## 2024-09-28 DIAGNOSIS — R7989 Other specified abnormal findings of blood chemistry: Secondary | ICD-10-CM

## 2024-09-28 LAB — TSH+FREE T4
Free T4: 1.33 ng/dL (ref 0.82–1.77)
TSH: 2.66 u[IU]/mL (ref 0.450–4.500)

## 2024-12-14 ENCOUNTER — Ambulatory Visit: Payer: Self-pay | Admitting: Nurse Practitioner

## 2025-03-22 ENCOUNTER — Ambulatory Visit: Payer: Self-pay

## 2025-08-14 ENCOUNTER — Encounter: Payer: Self-pay | Admitting: Nurse Practitioner

## 2025-08-16 ENCOUNTER — Encounter: Payer: Self-pay | Admitting: Nurse Practitioner
